# Patient Record
Sex: Male | Born: 1950 | Race: White | Hispanic: No | Marital: Married | Smoking: Former smoker
Health system: Southern US, Community
[De-identification: ages and names within clinical notes are randomized; demographics above are authoritative.]

## PROBLEM LIST (undated history)

## (undated) DIAGNOSIS — I10 Essential (primary) hypertension: Secondary | ICD-10-CM

## (undated) DIAGNOSIS — E119 Type 2 diabetes mellitus without complications: Secondary | ICD-10-CM

## (undated) DIAGNOSIS — R7989 Other specified abnormal findings of blood chemistry: Secondary | ICD-10-CM

## (undated) DIAGNOSIS — H9190 Unspecified hearing loss, unspecified ear: Secondary | ICD-10-CM

---

## 1995-12-07 HISTORY — PX: OTHER SURGICAL HISTORY: SHX169

## 1997-12-06 HISTORY — PX: KNEE ARTHROSCOPY: SUR90

## 2007-12-07 HISTORY — PX: SHOULDER ARTHROSCOPY: SHX128

## 2008-01-12 ENCOUNTER — Ambulatory Visit: Payer: Self-pay | Admitting: Orthopaedic Surgery

## 2008-01-12 ENCOUNTER — Other Ambulatory Visit: Payer: Self-pay

## 2008-01-19 ENCOUNTER — Ambulatory Visit: Payer: Self-pay | Admitting: Orthopaedic Surgery

## 2010-06-26 ENCOUNTER — Ambulatory Visit: Payer: Self-pay | Admitting: Family Medicine

## 2010-10-27 ENCOUNTER — Ambulatory Visit: Payer: Self-pay | Admitting: Gastroenterology

## 2011-04-16 ENCOUNTER — Ambulatory Visit: Payer: Self-pay

## 2011-05-07 ENCOUNTER — Ambulatory Visit: Payer: Self-pay

## 2012-07-01 ENCOUNTER — Ambulatory Visit: Payer: Self-pay | Admitting: Internal Medicine

## 2012-12-14 ENCOUNTER — Ambulatory Visit: Payer: Self-pay

## 2012-12-14 LAB — DOT URINE DIP
Blood: NEGATIVE
Specific Gravity: 1.02 (ref 1.003–1.030)

## 2014-06-09 ENCOUNTER — Ambulatory Visit: Payer: Self-pay | Admitting: Internal Medicine

## 2014-09-06 DIAGNOSIS — N529 Male erectile dysfunction, unspecified: Secondary | ICD-10-CM | POA: Insufficient documentation

## 2014-09-06 DIAGNOSIS — E785 Hyperlipidemia, unspecified: Secondary | ICD-10-CM | POA: Insufficient documentation

## 2014-09-06 DIAGNOSIS — E7800 Pure hypercholesterolemia, unspecified: Secondary | ICD-10-CM | POA: Insufficient documentation

## 2014-09-06 DIAGNOSIS — R600 Localized edema: Secondary | ICD-10-CM | POA: Insufficient documentation

## 2014-09-15 ENCOUNTER — Ambulatory Visit: Payer: Self-pay | Admitting: Family Medicine

## 2014-09-15 LAB — RAPID STREP-A WITH REFLX: Micro Text Report: NEGATIVE

## 2014-09-18 LAB — BETA STREP CULTURE(ARMC)

## 2016-10-12 DIAGNOSIS — IMO0001 Reserved for inherently not codable concepts without codable children: Secondary | ICD-10-CM | POA: Insufficient documentation

## 2016-10-12 DIAGNOSIS — E1165 Type 2 diabetes mellitus with hyperglycemia: Secondary | ICD-10-CM

## 2017-02-07 ENCOUNTER — Encounter: Payer: Self-pay | Admitting: Dietician

## 2017-02-07 ENCOUNTER — Encounter: Payer: Medicare Other | Attending: Family Medicine | Admitting: Dietician

## 2017-02-07 VITALS — BP 126/70 | Ht 70.0 in | Wt 249.0 lb

## 2017-02-07 DIAGNOSIS — Z713 Dietary counseling and surveillance: Secondary | ICD-10-CM | POA: Diagnosis not present

## 2017-02-07 DIAGNOSIS — E119 Type 2 diabetes mellitus without complications: Secondary | ICD-10-CM | POA: Diagnosis present

## 2017-02-07 NOTE — Patient Instructions (Signed)
   Include some healthy carbohydrate with each meal (whole grain, peas or corn, potato with skin, fruit). Aim for 3 carb servings or 45grams.   Keep up your healthy food choices, you are doing a great job!  Continue to control portions of starches and meats.   Continue to exercise regularly, add some walking as you are able.

## 2017-02-07 NOTE — Progress Notes (Signed)
Diabetes Self-Management Education  Visit Type: (P) First/Initial  Appt. Start Time: 0930 Appt. End Time: 1045  02/07/2017  [name], identified by name and date of birth, is a 66 y.o. male with a diagnosis of Diabetes: (P) Type 2.   ASSESSMENT  Blood pressure 126/70, height 5\' 10"  (1.778 m), weight 249 lb (112.9 kg). Body mass index is 35.73 kg/m.      Diabetes Self-Management Education - 02/07/17 0944      Visit Information   Visit Type (P)  First/Initial     Initial Visit   Diabetes Type (P)  Type 2   Are you currently following a meal plan? (P)  Yes   Date Diagnosed (P)  2003  2003     Health Coping   How would you rate your overall health? (P)  Good     Pre-Education Assessment   Patient understands the diabetes disease and treatment process. Demonstrates understanding / competency   Patient understands incorporating nutritional management into lifestyle. Needs Review   Patient undertands incorporating physical activity into lifestyle. Demonstrates understanding / competency   Patient understands using medications safely. Demonstrates understanding / competency   Patient understands monitoring blood glucose, interpreting and using results Needs Review   Patient understands prevention, detection, and treatment of acute complications. Needs Review   Patient understands prevention, detection, and treatment of chronic complications. Needs Review   Patient understands how to develop strategies to address psychosocial issues. Needs Review   Patient understands how to develop strategies to promote health/change behavior. Demonstrates understanding / competency     Complications   Last HgB A1C per patient/outside source 8 %   How often do you check your blood sugar? 3-4 times / week   Fasting Blood glucose range (mg/dL) 16-109  patient reports 110-120s   Postprandial Blood glucose range (mg/dL) 604-540  recent reading of 140   Number of hypoglycemic episodes per  month 0   Number of hyperglycemic episodes per week 0   Have you had a dilated eye exam in the past 12 months? Yes   Have you had a dental exam in the past 12 months? Yes   Are you checking your feet? Yes   How many days per week are you checking your feet? 6  occasional swelling, takes furosemide on weekends, not during week due to work.      Dietary Intake   Breakfast cornbread with butter with coffee 4am; recently started slimfast shake   Snack (morning) pork chop sandwich   Lunch  none; recently started eating sandwich   Dinner meat, vegetables and/or salad   Snack (evening) sugar free pudding   Beverage(s) sometimes diet coke, diet green tea, water     Exercise   Exercise Type Other (comment)  bowflex   How many days per week to you exercise? 5   How many minutes per day do you exercise? 15   Total minutes per week of exercise 75     Patient Education   Previous Diabetes Education No   Nutrition management  Role of diet in the treatment of diabetes and the relationship between the three main macronutrients and blood glucose level;Food label reading, portion sizes and measuring food.;Reviewed blood glucose goals for pre and post meals and how to evaluate the patients' food intake on their blood glucose level.;Meal timing in regards to the patients' current diabetes medication.;Meal options for control of blood glucose level and chronic complications.;Other (comment)  basic meal planning for 1600-1700kcal intake  Physical activity and exercise  Role of exercise on diabetes management, blood pressure control and cardiac health.;Helped patient identify appropriate exercises in relation to his/her diabetes, diabetes complications and other health issue.   Monitoring Taught/discussed recording of test results and interpretation of SMBG.   Personal strategies to promote health Lifestyle issues that need to be addressed for better diabetes care     Post-Education Assessment   Patient  understands the diabetes disease and treatment process. Demonstrates understanding / competency   Patient understands incorporating nutritional management into lifestyle. Demonstrates understanding / competency   Patient undertands incorporating physical activity into lifestyle. Demonstrates understanding / competency   Patient understands using medications safely. Demonstrates understanding / competency   Patient understands monitoring blood glucose, interpreting and using results Demonstrates understanding / competency   Patient understands prevention, detection, and treatment of acute complications. Needs Review   Patient understands prevention, detection, and treatment of chronic complications. Needs Review   Patient understands how to develop strategies to address psychosocial issues. Needs Review   Patient understands how to develop strategies to promote health/change behavior. Demonstrates understanding / competency     Outcomes   Expected Outcomes Demonstrated interest in learning. Expect positive outcomes   Future DMSE PRN   Program Status Completed      Individualized Plan for Diabetes Self-Management Training:   Learning Objective:  Patient will have a greater understanding of diabetes self-management. Patient education plan is to attend individual and/or group sessions per assessed needs and concerns.  Patient has made several positive changes in the past 1-2 weeks; he has decreased intake of carbohydrate foods and has increased vegetable intake. He has incorporated some regular exercise and plans to gradually increase. He requested help with healthy menus which were provided.    Plan:   Patient Instructions   Include some healthy carbohydrate with each meal (whole grain, peas or corn, potato with skin, fruit). Aim for 3 carb servings or 45grams.   Keep up your healthy food choices, you are doing a great job!  Continue to control portions of starches and meats.    Continue to exercise regularly, add some walking as you are able.    Expected Outcomes:  Demonstrated interest in learning. Expect positive outcomes  Education material provided: Engineer, siteMyPlate planner, Planning A Balanced Meal; Quick and Healthy Meal Ideas; Smart Snacking.   If problems or questions, patient to contact team via:  Phone and Email  Future DSME appointment: PRN

## 2017-04-04 ENCOUNTER — Ambulatory Visit
Admission: EM | Admit: 2017-04-04 | Discharge: 2017-04-04 | Disposition: A | Discharge status: Home / Self Care | Payer: Medicare Other | Attending: Family Medicine | Admitting: Family Medicine

## 2017-04-04 ENCOUNTER — Encounter: Payer: Self-pay | Admitting: *Deleted

## 2017-04-04 DIAGNOSIS — R05 Cough: Secondary | ICD-10-CM

## 2017-04-04 DIAGNOSIS — R059 Cough, unspecified: Secondary | ICD-10-CM

## 2017-04-04 DIAGNOSIS — J0110 Acute frontal sinusitis, unspecified: Secondary | ICD-10-CM

## 2017-04-04 HISTORY — DX: Type 2 diabetes mellitus without complications: E11.9

## 2017-04-04 MED ORDER — AMOXICILLIN-POT CLAVULANATE 875-125 MG PO TABS: 1.0000 | ORAL_TABLET | Freq: Two times a day (BID) | ORAL | 0 refills | Status: DC

## 2017-04-04 MED ORDER — HYDROCOD POLST-CPM POLST ER 10-8 MG/5ML PO SUER: 5.0000 mL | Freq: Every evening | ORAL | 0 refills | Status: DC | PRN

## 2017-04-04 MED ORDER — BENZONATATE 100 MG PO CAPS: 100.0000 mg | ORAL_CAPSULE | Freq: Three times a day (TID) | ORAL | 0 refills | Status: DC | PRN

## 2017-04-04 NOTE — ED Triage Notes (Signed)
Productive cough- green, runny nose, watery eyes, x1-2 weeks. Past hx of allergy problems

## 2017-04-04 NOTE — ED Provider Notes (Signed)
[facility] ____________________________________________  Time seen: Approximately 10:12 AM  I have reviewed the triage vital signs and the nursing notes.   HISTORY  Chief Complaint Cough and Nasal Congestion   HPI Donald Yang Sr. is a 66 y.o. male present for evaluation of 1.5-2 weeks of runny nose, nasal congestion, sinus pressure and sinus drainage. Reports some postnasal drainage and cough. Denies known sick contacts. Reports does have some seasonal allergies and states that he feels that's what was initiating this. Reports symptoms have been reviewed and resolved with over-the-counter DayQuil, NyQuil and Claritin. Denies known wheezing. Reports has continued to eat and drink well. Reports has continued to remain active. Denies fevers.  Denies chest pain, shortness of breath, abdominal pain, dysuria, extremity pain, extremity swelling or rash. Denies recent sickness. Denies recent antibiotic use.  Reports diabetes is been well controlled.  [facility], Madaline Guthrie, MD PCP    Past Medical History:  Diagnosis Date  . Diabetes mellitus without complication [facility])     Patient Active Problem List   Diagnosis Date Noted  . Uncontrolled type 2 diabetes mellitus without complication, without long-term current use of insulin (HCC) 10/12/2016  . Bilateral edema of lower extremity 09/06/2014  . ED (erectile dysfunction) 09/06/2014  . Elevated cholesterol 09/06/2014    History reviewed. No pertinent surgical history.   No current facility-administered medications for this encounter.   Current Outpatient Prescriptions:  .  atorvastatin (LIPITOR) 20 MG tablet, Take by mouth., Disp: , Rfl:  .  glyBURIDE (DIABETA) 5 MG tablet, TAKE 2 TABLETS TWICE A DAY FOR DIABETES, Disp: , Rfl:  .  pioglitazone (ACTOS) 45 MG tablet, TAKE 1 TABLET DAILY, Disp: , Rfl:  .  amoxicillin-clavulanate (AUGMENTIN) 875-125 MG tablet, Take 1 tablet by mouth every 12 (twelve) hours., Disp: 20  tablet, Rfl: 0 .  benzonatate (TESSALON PERLES) 100 MG capsule, Take 1 capsule (100 mg total) by mouth 3 (three) times daily as needed for cough., Disp: 15 capsule, Rfl: 0 .  chlorpheniramine-HYDROcodone (TUSSIONEX PENNKINETIC ER) 10-8 MG/5ML SUER, Take 5 mLs by mouth at bedtime as needed for cough. do not drive or operate machinery while taking as can cause drowsiness., Disp: 75 mL, Rfl: 0 .  furosemide (LASIX) 20 MG tablet, TAKE 1 TABLET DAILY, Disp: , Rfl:  .  naproxen sodium (ANAPROX) 220 MG tablet, Take by mouth., Disp: , Rfl:  .  sildenafil (VIAGRA) 100 MG tablet, TAKE 1 TABLET AS DIRECTED, Disp: , Rfl:   Allergies Metformin  History reviewed. No pertinent family history.  Social History Social History  Substance Use Topics  . Smoking status: Former Smoker    Types: Cigarettes    Quit date: 02/08/1992  . Smokeless tobacco: Never Used  . Alcohol use No     Comment: rarely 1 beer    Review of Systems Constitutional: No fever/chills ENT: No sore throat. Cardiovascular: Denies chest pain. Respiratory: Denies shortness of breath. Gastrointestinal: No abdominal pain.  No nausea, no vomiting.  No diarrhea.  No constipation. Genitourinary: Negative for dysuria. Musculoskeletal: Negative for back pain. Skin: Negative for rash.   ____________________________________________   PHYSICAL EXAM:  VITAL SIGNS: ED Triage Vitals  Enc Vitals Group     BP 04/04/17 0938 (!) 158/70     Pulse Rate 04/04/17 0938 69     Resp 04/04/17 0938 16     Temp 04/04/17 0938 98.4 F (36.9 C)     Temp Source 04/04/17 0938 Oral     SpO2 04/04/17 1610  97 %     Weight 04/04/17 0940 245 lb (111.1 kg)     Height 04/04/17 0940  (1.778 m)     Head Circumference --      Peak Flow --      Pain Score --      Pain Loc --      Pain Edu? --      Excl. in GC? --     Constitutional: Alert and oriented. Well appearing and in no acute distress. Eyes: Conjunctivae are normal. PERRL. EOMI. Head:  Atraumatic.Mild to moderate tenderness to palpation bilateral frontal and nontender maxillary sinuses. No swelling. No erythema.   Ears: no erythema, normal TMs bilaterally.   Nose: nasal congestion with bilateral nasal turbinate erythema and edema.   Mouth/Throat: Mucous membranes are moist.  Oropharynx non-erythematous.No tonsillar swelling or exudate.  Neck: No stridor.  No cervical spine tenderness to palpation. Hematological/Lymphatic/Immunilogical: No cervical lymphadenopathy. Cardiovascular: Normal rate, regular rhythm. Grossly normal heart sounds.  Good peripheral circulation. Respiratory: Normal respiratory effort.  No retractions.No wheezes, rales or rhonchi. Good air movement. Dry intermittent cough noted in room. Gastrointestinal: Soft and nontender.  Musculoskeletal: Ambulatory with steady gait. Neurologic:  Normal speech and language. No gait instability. Skin:  Skin is warm, dry and intact. No rash noted. Psychiatric: Mood and affect are normal. Speech and behavior are normal.  ___________________________________________   LABS (all labs ordered are listed, but only abnormal results are displayed)  Labs Reviewed - No data to display   PROCEDURES Procedures    INITIAL IMPRESSION / ASSESSMENT AND PLAN / ED COURSE  Pertinent labs & imaging results that were available during my care of the patient were reviewed by me and considered in my medical decision making (see chart for details).  Well-appearing patient. No acute distress. Suspect sinusitis, allergic rhinitis with associated cough. Will treat patient with oral Augmentin, when necessary Tessalon Perles and when necessary Tussionex. Encourage rest, fluids and supportive care.Discussed indication, risks and benefits of medications with patient.  Discussed follow up with Primary care physician this week. Discussed follow up and return parameters including no resolution or any worsening concerns. Patient verbalized  understanding and agreed to plan.   ____________________________________________   FINAL CLINICAL IMPRESSION(S) / ED DIAGNOSES  Final diagnoses:  Acute frontal sinusitis, recurrence not specified  Cough     Discharge Medication List as of 04/04/2017 10:14 AM    START taking these medications   Details  amoxicillin-clavulanate (AUGMENTIN) 875-125 MG tablet Take 1 tablet by mouth every 12 (twelve) hours., Starting Mon 04/04/2017, Normal    benzonatate (TESSALON PERLES) 100 MG capsule Take 1 capsule (100 mg total) by mouth 3 (three) times daily as needed for cough., Starting Mon 04/04/2017, Normal    chlorpheniramine-HYDROcodone (TUSSIONEX PENNKINETIC ER) 10-8 MG/5ML SUER Take 5 mLs by mouth at bedtime as needed for cough. do not drive or operate machinery while taking as can cause drowsiness., Starting Mon 04/04/2017, Print        Note: This dictation was prepared with Dragon dictation along with smaller phrase technology. Any transcriptional errors that result from this process are unintentional.         Renford Dills, NP 04/04/17 1051

## 2017-04-04 NOTE — Discharge Instructions (Signed)
Take medication as prescribed. Rest. Drink plenty of fluids.  ° °Follow up with your primary care physician this week as needed. Return to Urgent care for new or worsening concerns.  ° °

## 2018-11-30 ENCOUNTER — Other Ambulatory Visit: Payer: Self-pay

## 2018-11-30 ENCOUNTER — Encounter
Admission: RE | Admit: 2018-11-30 | Discharge: 2018-11-30 | Disposition: A | Discharge status: Home / Self Care | Payer: Medicare Other | Source: Ambulatory Visit | Attending: Orthopedic Surgery | Admitting: Orthopedic Surgery

## 2018-11-30 DIAGNOSIS — Z01812 Encounter for preprocedural laboratory examination: Secondary | ICD-10-CM | POA: Insufficient documentation

## 2018-11-30 DIAGNOSIS — Z01810 Encounter for preprocedural cardiovascular examination: Secondary | ICD-10-CM | POA: Diagnosis not present

## 2018-11-30 HISTORY — DX: Other specified abnormal findings of blood chemistry: R79.89

## 2018-11-30 HISTORY — DX: Other disorders of bilirubin metabolism: E80.6

## 2018-11-30 LAB — CBC
HCT: 41.6 % (ref 39.0–52.0)
HEMOGLOBIN: 14.6 g/dL (ref 13.0–17.0)
MCH: 29.9 pg (ref 26.0–34.0)
MCHC: 35.1 g/dL (ref 30.0–36.0)
MCV: 85.1 fL (ref 80.0–100.0)
Platelets: 227 10*3/uL (ref 150–400)
RBC: 4.89 MIL/uL (ref 4.22–5.81)
RDW: 12.8 % (ref 11.5–15.5)
WBC: 7.1 10*3/uL (ref 4.0–10.5)
nRBC: 0 % (ref 0.0–0.2)

## 2018-11-30 NOTE — Patient Instructions (Signed)
Your procedure is scheduled on: 12/11/18 Report to Day Surgery. [facility]LO[facility] To find out your arrival time please call [phone] between 1PM - 3PM on 12/08/17.  Remember: Instructions that are not followed completely may result in serious medical risk,  up to and including death, or upon the discretion of your surgeon and anesthesiologist your  surgery may need to be rescheduled.     _X__ 1. Do not eat food after midnight the night before your procedure.                 No gum chewing or hard candies. You may drink clear liquids up to 2 hours                 before you are scheduled to arrive for your surgery- DO not drink clear                 liquids within 2 hours of the start of your surgery.                 Clear Liquids include:  water, apple juice without pulp, clear carbohydrate                 drink such as Clearfast of Gatorade, Black Coffee or Tea (Do not add                 anything to coffee or tea).  __X__2.  On the morning of surgery brush your teeth with toothpaste and water, you                may rinse your mouth with mouthwash if you wish.  Do not swallow any toothpaste of mouthwash.     _X__ 3.  No Alcohol for 24 hours before or after surgery.   _X__ 4.  Do Not Smoke or use e-cigarettes For 24 Hours Prior to Your Surgery.                 Do not use any chewable tobacco products for at least 6 hours prior to                 surgery.  ____  5.  Bring all medications with you on the day of surgery if instructed.   __X__  6.  Notify your doctor if there is any change in your medical condition      (cold, fever, infections).     Do not wear jewelry, make-up, hairpins, clips or nail polish. Do not wear lotions, powders, or perfumes. You may wear deodorant. Do not shave 48 hours prior to surgery. Men may shave face and neck. Do not bring valuables to the hospital.    [facility] any belongings or  valuables.  Contacts, dentures or bridgework may not be worn into surgery. Leave your suitcase in the car. After surgery it may be brought to your room. For patients admitted to the hospital, discharge time is determined by your treatment team.   Patients discharged the day of surgery will not be allowed to drive home.   Please read over the following fact sheets that you were given:   Surgical Site Infection Prevention    ____ Take these medicines the morning of surgery with A SIP OF WATER:    1. NONE  2.   3.   4.  5.  6.  ____ Fleet Enema (as directed)   __X__ Use CHG Soap as directed  ____ Use inhalers on the day of surgery  ____ Stop metformin 2 days prior to surgery    ____ Take 1/2 of usual insulin dose the night before surgery. No insulin the morning          of surgery.   ____ Stop Coumadin/Plavix/aspirin on   __X__ Stop Anti-inflammatories on  11/30/18  NO MORE ALEVE UNTIL AFTER SURGERY.   CAN TAKE TYLENOL  ____ Stop supplements until after surgery.    ____ Bring C-Pap to the hospital.

## 2018-12-11 ENCOUNTER — Encounter: Payer: Self-pay | Admitting: *Deleted

## 2018-12-11 ENCOUNTER — Other Ambulatory Visit: Payer: Self-pay

## 2018-12-11 ENCOUNTER — Ambulatory Visit: Payer: Medicare Other | Admitting: Anesthesiology

## 2018-12-11 ENCOUNTER — Encounter
Admission: RE | Disposition: A | Discharge status: Home / Self Care | Payer: Self-pay | Source: Home / Self Care | Attending: Orthopedic Surgery

## 2018-12-11 ENCOUNTER — Ambulatory Visit
Admission: RE | Admit: 2018-12-11 | Discharge: 2018-12-11 | Disposition: A | Discharge status: Home / Self Care | Payer: Medicare Other | Attending: Orthopedic Surgery | Admitting: Orthopedic Surgery

## 2018-12-11 DIAGNOSIS — Z7984 Long term (current) use of oral hypoglycemic drugs: Secondary | ICD-10-CM | POA: Diagnosis not present

## 2018-12-11 DIAGNOSIS — M75101 Unspecified rotator cuff tear or rupture of right shoulder, not specified as traumatic: Secondary | ICD-10-CM | POA: Insufficient documentation

## 2018-12-11 DIAGNOSIS — Z87891 Personal history of nicotine dependence: Secondary | ICD-10-CM | POA: Diagnosis not present

## 2018-12-11 DIAGNOSIS — S46211A Strain of muscle, fascia and tendon of other parts of biceps, right arm, initial encounter: Secondary | ICD-10-CM | POA: Insufficient documentation

## 2018-12-11 DIAGNOSIS — E119 Type 2 diabetes mellitus without complications: Secondary | ICD-10-CM | POA: Insufficient documentation

## 2018-12-11 DIAGNOSIS — M7541 Impingement syndrome of right shoulder: Secondary | ICD-10-CM | POA: Diagnosis not present

## 2018-12-11 HISTORY — DX: Unspecified hearing loss, unspecified ear: H91.90

## 2018-12-11 LAB — GLUCOSE, CAPILLARY
Glucose-Capillary: 139 mg/dL — ABNORMAL HIGH (ref 70–99)
Glucose-Capillary: 150 mg/dL — ABNORMAL HIGH (ref 70–99)

## 2018-12-11 SURGERY — SHOULDER ARTHROSCOPY WITH SUBACROMIAL DECOMPRESSION AND DISTAL CLAVICLE EXCISION
Anesthesia: General | Site: Shoulder | Laterality: Right

## 2018-12-11 MED ORDER — MIDAZOLAM HCL 2 MG/2ML IJ SOLN
2.0000 mg | Freq: Once | INTRAMUSCULAR | Status: AC
  Administered 2018-12-11: 2 mg via INTRAVENOUS

## 2018-12-11 MED ORDER — ACETAMINOPHEN 500 MG PO TABS: 1000.0000 mg | ORAL_TABLET | Freq: Three times a day (TID) | ORAL | 2 refills | Status: AC

## 2018-12-11 MED ORDER — FENTANYL CITRATE (PF) 100 MCG/2ML IJ SOLN
INTRAMUSCULAR | Status: AC
  Administered 2018-12-11: 50 ug via INTRAVENOUS
  Filled 2018-12-11: qty 2

## 2018-12-11 MED ORDER — ACETAMINOPHEN 10 MG/ML IV SOLN
INTRAVENOUS | Status: AC
  Filled 2018-12-11: qty 100

## 2018-12-11 MED ORDER — LIDOCAINE HCL (PF) 1 % IJ SOLN
INTRAMUSCULAR | Status: AC
  Filled 2018-12-11: qty 30

## 2018-12-11 MED ORDER — BUPIVACAINE HCL (PF) 0.5 % IJ SOLN
INTRAMUSCULAR | Status: AC
  Filled 2018-12-11: qty 10

## 2018-12-11 MED ORDER — BUPIVACAINE HCL (PF) 0.5 % IJ SOLN
INTRAMUSCULAR | Status: DC | PRN
  Administered 2018-12-11: 10 mL

## 2018-12-11 MED ORDER — FENTANYL CITRATE (PF) 100 MCG/2ML IJ SOLN
50.0000 ug | Freq: Once | INTRAMUSCULAR | Status: AC
  Administered 2018-12-11: 50 ug via INTRAVENOUS

## 2018-12-11 MED ORDER — SEVOFLURANE IN SOLN
RESPIRATORY_TRACT | Status: AC
  Filled 2018-12-11: qty 250

## 2018-12-11 MED ORDER — EPHEDRINE SULFATE 50 MG/ML IJ SOLN
INTRAMUSCULAR | Status: DC | PRN
  Administered 2018-12-11: 10 mg via INTRAVENOUS
  Administered 2018-12-11 (×3): 5 mg via INTRAVENOUS

## 2018-12-11 MED ORDER — ONDANSETRON HCL 4 MG/2ML IJ SOLN
INTRAMUSCULAR | Status: AC
  Filled 2018-12-11: qty 2

## 2018-12-11 MED ORDER — FAMOTIDINE 20 MG PO TABS
ORAL_TABLET | ORAL | Status: AC
  Administered 2018-12-11: 20 mg via ORAL
  Filled 2018-12-11: qty 1

## 2018-12-11 MED ORDER — BUPIVACAINE LIPOSOME 1.3 % IJ SUSP
INTRAMUSCULAR | Status: DC | PRN
  Administered 2018-12-11: 20 mL

## 2018-12-11 MED ORDER — ROCURONIUM BROMIDE 100 MG/10ML IV SOLN
INTRAVENOUS | Status: DC | PRN
  Administered 2018-12-11: 20 mg via INTRAVENOUS
  Administered 2018-12-11: 10 mg via INTRAVENOUS
  Administered 2018-12-11: 20 mg via INTRAVENOUS
  Administered 2018-12-11: 5 mg via INTRAVENOUS
  Administered 2018-12-11: 35 mg via INTRAVENOUS
  Administered 2018-12-11: 10 mg via INTRAVENOUS

## 2018-12-11 MED ORDER — NEOMYCIN-POLYMYXIN B GU 40-200000 IR SOLN
Status: DC | PRN
  Administered 2018-12-11: 2 mL

## 2018-12-11 MED ORDER — CEFAZOLIN SODIUM 1 G IJ SOLR
INTRAMUSCULAR | Status: AC
  Filled 2018-12-11: qty 20

## 2018-12-11 MED ORDER — LIDOCAINE HCL (CARDIAC) PF 100 MG/5ML IV SOSY
PREFILLED_SYRINGE | INTRAVENOUS | Status: DC | PRN
  Administered 2018-12-11: 80 mg via INTRAVENOUS

## 2018-12-11 MED ORDER — SODIUM CHLORIDE FLUSH 0.9 % IV SOLN
INTRAVENOUS | Status: AC
  Filled 2018-12-11: qty 10

## 2018-12-11 MED ORDER — PROMETHAZINE HCL 25 MG/ML IJ SOLN
INTRAMUSCULAR | Status: AC
  Administered 2018-12-11: 6.25 mg via INTRAVENOUS
  Filled 2018-12-11: qty 1

## 2018-12-11 MED ORDER — MIDAZOLAM HCL 2 MG/2ML IJ SOLN
INTRAMUSCULAR | Status: DC | PRN
  Administered 2018-12-11 (×2): .5 mg via INTRAVENOUS

## 2018-12-11 MED ORDER — ONDANSETRON HCL 4 MG/2ML IJ SOLN
INTRAMUSCULAR | Status: DC | PRN
  Administered 2018-12-11: 4 mg via INTRAVENOUS

## 2018-12-11 MED ORDER — BUPIVACAINE LIPOSOME 1.3 % IJ SUSP
INTRAMUSCULAR | Status: AC
  Filled 2018-12-11: qty 20

## 2018-12-11 MED ORDER — DEXAMETHASONE SODIUM PHOSPHATE 10 MG/ML IJ SOLN
INTRAMUSCULAR | Status: DC | PRN
  Administered 2018-12-11: 5 mg via INTRAVENOUS

## 2018-12-11 MED ORDER — ONDANSETRON HCL 4 MG/2ML IJ SOLN
4.0000 mg | Freq: Once | INTRAMUSCULAR | Status: AC | PRN
  Administered 2018-12-11: 4 mg via INTRAVENOUS

## 2018-12-11 MED ORDER — PROPOFOL 10 MG/ML IV BOLUS
INTRAVENOUS | Status: DC | PRN
  Administered 2018-12-11: 140 mg via INTRAVENOUS

## 2018-12-11 MED ORDER — MIDAZOLAM HCL 2 MG/2ML IJ SOLN
INTRAMUSCULAR | Status: AC
  Administered 2018-12-11: 2 mg via INTRAVENOUS
  Filled 2018-12-11: qty 2

## 2018-12-11 MED ORDER — SUGAMMADEX SODIUM 200 MG/2ML IV SOLN
INTRAVENOUS | Status: DC | PRN
  Administered 2018-12-11: 200 mg via INTRAVENOUS

## 2018-12-11 MED ORDER — LIDOCAINE HCL (PF) 1 % IJ SOLN
INTRAMUSCULAR | Status: AC
  Filled 2018-12-11: qty 5

## 2018-12-11 MED ORDER — NEOMYCIN-POLYMYXIN B GU 40-200000 IR SOLN
Status: AC
  Filled 2018-12-11: qty 2

## 2018-12-11 MED ORDER — EPINEPHRINE 30 MG/30ML IJ SOLN
INTRAMUSCULAR | Status: AC
  Filled 2018-12-11: qty 1

## 2018-12-11 MED ORDER — LIDOCAINE-EPINEPHRINE 1 %-1:100000 IJ SOLN
INTRAMUSCULAR | Status: AC
  Filled 2018-12-11: qty 1

## 2018-12-11 MED ORDER — ROCURONIUM BROMIDE 50 MG/5ML IV SOLN
INTRAVENOUS | Status: AC
  Filled 2018-12-11: qty 1

## 2018-12-11 MED ORDER — SUCCINYLCHOLINE CHLORIDE 20 MG/ML IJ SOLN
INTRAMUSCULAR | Status: AC
  Filled 2018-12-11: qty 1

## 2018-12-11 MED ORDER — GLYCOPYRROLATE 0.2 MG/ML IJ SOLN
INTRAMUSCULAR | Status: DC | PRN
  Administered 2018-12-11: .2 mg via INTRAVENOUS

## 2018-12-11 MED ORDER — PROPOFOL 10 MG/ML IV BOLUS
INTRAVENOUS | Status: AC
  Filled 2018-12-11: qty 20

## 2018-12-11 MED ORDER — FENTANYL CITRATE (PF) 100 MCG/2ML IJ SOLN
INTRAMUSCULAR | Status: DC | PRN
  Administered 2018-12-11: 50 ug via INTRAVENOUS

## 2018-12-11 MED ORDER — OXYCODONE HCL 5 MG PO TABS: 5.0000 mg | ORAL_TABLET | ORAL | 0 refills | Status: AC | PRN

## 2018-12-11 MED ORDER — ASPIRIN EC 325 MG PO TBEC: 325.0000 mg | DELAYED_RELEASE_TABLET | Freq: Every day | ORAL | 0 refills | Status: AC

## 2018-12-11 MED ORDER — LACTATED RINGERS IV SOLN
INTRAVENOUS | Status: DC | PRN
  Administered 2018-12-11: 30 mL

## 2018-12-11 MED ORDER — FENTANYL CITRATE (PF) 100 MCG/2ML IJ SOLN
INTRAMUSCULAR | Status: AC
  Filled 2018-12-11: qty 2

## 2018-12-11 MED ORDER — FAMOTIDINE 20 MG PO TABS
20.0000 mg | ORAL_TABLET | Freq: Once | ORAL | Status: AC
  Administered 2018-12-11: 20 mg via ORAL

## 2018-12-11 MED ORDER — SUCCINYLCHOLINE CHLORIDE 20 MG/ML IJ SOLN
INTRAMUSCULAR | Status: DC | PRN
  Administered 2018-12-11: 120 mg via INTRAVENOUS

## 2018-12-11 MED ORDER — PROMETHAZINE HCL 25 MG/ML IJ SOLN
6.2500 mg | Freq: Once | INTRAMUSCULAR | Status: AC
  Administered 2018-12-11: 6.25 mg via INTRAVENOUS

## 2018-12-11 MED ORDER — SUGAMMADEX SODIUM 200 MG/2ML IV SOLN
INTRAVENOUS | Status: AC
  Filled 2018-12-11: qty 2

## 2018-12-11 MED ORDER — ONDANSETRON 4 MG PO TBDP: 4.0000 mg | ORAL_TABLET | Freq: Three times a day (TID) | ORAL | 0 refills | Status: DC | PRN

## 2018-12-11 MED ORDER — ESMOLOL HCL 100 MG/10ML IV SOLN
INTRAVENOUS | Status: DC | PRN
  Administered 2018-12-11 (×2): 10 mg via INTRAVENOUS

## 2018-12-11 MED ORDER — CEFAZOLIN SODIUM-DEXTROSE 2-4 GM/100ML-% IV SOLN
INTRAVENOUS | Status: AC
  Filled 2018-12-11: qty 100

## 2018-12-11 MED ORDER — MIDAZOLAM HCL 2 MG/2ML IJ SOLN
INTRAMUSCULAR | Status: AC
  Filled 2018-12-11: qty 2

## 2018-12-11 MED ORDER — SODIUM CHLORIDE (PF) 0.9 % IJ SOLN
INTRAMUSCULAR | Status: AC
  Filled 2018-12-11: qty 20

## 2018-12-11 MED ORDER — CEFAZOLIN SODIUM-DEXTROSE 2-4 GM/100ML-% IV SOLN
2.0000 g | Freq: Once | INTRAVENOUS | Status: AC
  Administered 2018-12-11 (×2): 2 g via INTRAVENOUS

## 2018-12-11 MED ORDER — ACETAMINOPHEN 10 MG/ML IV SOLN
INTRAVENOUS | Status: DC | PRN
  Administered 2018-12-11: 1000 mg via INTRAVENOUS

## 2018-12-11 MED ORDER — FENTANYL CITRATE (PF) 100 MCG/2ML IJ SOLN: 25.0000 ug | INTRAMUSCULAR | Status: DC | PRN

## 2018-12-11 MED ORDER — BUPIVACAINE-EPINEPHRINE (PF) 0.5% -1:200000 IJ SOLN
INTRAMUSCULAR | Status: AC
  Filled 2018-12-11: qty 90

## 2018-12-11 MED ORDER — SODIUM CHLORIDE 0.9 % IV SOLN
INTRAVENOUS | Status: DC
  Administered 2018-12-11: 14:00:00 via INTRAVENOUS
  Administered 2018-12-11: 75 mL/h via INTRAVENOUS

## 2018-12-11 SURGICAL SUPPLY — 79 items
ADAPTER IRRIG TUBE 2 SPIKE SOL (ADAPTER) ×6 IMPLANT
ANCHOR SUT BIO SW 4.75X19.1 (Anchor) ×4 IMPLANT
ANCHOR SWIVELOCK BIO COMP (Anchor) ×2 IMPLANT
BRUSH SCRUB EZ  4% CHG (MISCELLANEOUS) ×2
BRUSH SCRUB EZ 4% CHG (MISCELLANEOUS) ×1 IMPLANT
BUR RADIUS 4.0X18.5 (BURR) ×5 IMPLANT
BUR RADIUS 5.5 (BURR) ×3 IMPLANT
CANNULA 5.75X7CM (CANNULA) ×2
CANNULA PART THRD DISP 5.75X7 (CANNULA) ×4 IMPLANT
CANNULA PARTIAL THREAD 2X7 (CANNULA) ×5 IMPLANT
CANNULA PASSPORT 3 (MISCELLANEOUS) ×1 IMPLANT
CANNULA PASSPORT 3CM (MISCELLANEOUS) ×1
CANNULA TWIST IN 8.25X9CM (CANNULA) IMPLANT
CHLORAPREP W/TINT 26ML (MISCELLANEOUS) ×3 IMPLANT
CLOSURE WOUND 1/2 X4 (GAUZE/BANDAGES/DRESSINGS) ×1
COOLER POLAR GLACIER W/PUMP (MISCELLANEOUS) ×3 IMPLANT
COVER WAND RF STERILE (DRAPES) ×3 IMPLANT
CRADLE LAMINECT ARM (MISCELLANEOUS) ×3 IMPLANT
DRAPE IMP U-DRAPE 54X76 (DRAPES) ×6 IMPLANT
DRAPE INCISE IOBAN 66X45 STRL (DRAPES) ×3 IMPLANT
DRAPE SHEET LG 3/4 BI-LAMINATE (DRAPES) ×3 IMPLANT
DRAPE STERI 35X30 U-POUCH (DRAPES) ×3 IMPLANT
DRSG TEGADERM 2-3/8X2-3/4 SM (GAUZE/BANDAGES/DRESSINGS) ×4 IMPLANT
DRSG TEGADERM 4X4.75 (GAUZE/BANDAGES/DRESSINGS) ×2 IMPLANT
ELECT REM PT RETURN 9FT ADLT (ELECTROSURGICAL)
ELECTRODE REM PT RTRN 9FT ADLT (ELECTROSURGICAL) IMPLANT
GAUZE PETRO XEROFOAM 1X8 (MISCELLANEOUS) ×3 IMPLANT
GAUZE SPONGE 4X4 12PLY STRL (GAUZE/BANDAGES/DRESSINGS) ×3 IMPLANT
GAUZE XEROFORM 4X4 STRL (GAUZE/BANDAGES/DRESSINGS) ×2 IMPLANT
GLOVE BIO SURGEON STRL SZ7.5 (GLOVE) ×3 IMPLANT
GLOVE BIOGEL PI IND STRL 8 (GLOVE) ×2 IMPLANT
GLOVE BIOGEL PI INDICATOR 8 (GLOVE) ×4
GLOVE SURG SYN 7.5  E (GLOVE) ×2
GLOVE SURG SYN 7.5 E (GLOVE) ×1 IMPLANT
GLOVE SURG SYN 7.5 PF PI (GLOVE) ×1 IMPLANT
GOWN STRL REUS W/ TWL LRG LVL3 (GOWN DISPOSABLE) ×2 IMPLANT
GOWN STRL REUS W/TWL LRG LVL3 (GOWN DISPOSABLE) ×4
GOWN STRL REUS W/TWL LRG LVL4 (GOWN DISPOSABLE) ×3 IMPLANT
IMP SYSTEM BRIDGE 4.75X19.1 (Anchor) ×3 IMPLANT
IMPL SYSTEM BRIDGE 4.75X19.1 (Anchor) IMPLANT
IV LACTATED RINGER IRRG 3000ML (IV SOLUTION) ×60
IV LR IRRIG 3000ML ARTHROMATIC (IV SOLUTION) ×4 IMPLANT
KIT STABILIZATION SHOULDER (MISCELLANEOUS) ×3 IMPLANT
KIT SUTURETAK 3.0 INSERT PERC (KITS) ×3 IMPLANT
KIT TURNOVER KIT A (KITS) ×3 IMPLANT
MANIFOLD NEPTUNE II (INSTRUMENTS) ×3 IMPLANT
MASK FACE SPIDER DISP (MASK) ×3 IMPLANT
MAT ABSORB  FLUID 56X50 GRAY (MISCELLANEOUS) ×4
MAT ABSORB FLUID 56X50 GRAY (MISCELLANEOUS) ×2 IMPLANT
NDL SAFETY ECLIPSE 18X1.5 (NEEDLE) ×1 IMPLANT
NDL SCORPION MULTI FIRE (NEEDLE) IMPLANT
NEEDLE HYPO 18GX1.5 SHARP (NEEDLE) ×2
NEEDLE HYPO 22GX1.5 SAFETY (NEEDLE) ×3 IMPLANT
NEEDLE SCORPION MULTI FIRE (NEEDLE) ×3 IMPLANT
NS IRRIG 500ML POUR BTL (IV SOLUTION) ×3 IMPLANT
PACK ARTHROSCOPY SHOULDER (MISCELLANEOUS) ×3 IMPLANT
PAD ABD DERMACEA PRESS 5X9 (GAUZE/BANDAGES/DRESSINGS) ×3 IMPLANT
PAD WRAPON POLAR SHDR XLG (MISCELLANEOUS) ×1 IMPLANT
SET TUBE SUCT SHAVER OUTFL 24K (TUBING) ×3 IMPLANT
SET TUBE TIP INTRA-ARTICULAR (MISCELLANEOUS) ×3 IMPLANT
SLING ULTRA II M (MISCELLANEOUS) ×3 IMPLANT
STRAP SAFETY 5IN WIDE (MISCELLANEOUS) ×3 IMPLANT
STRIP CLOSURE SKIN 1/2X4 (GAUZE/BANDAGES/DRESSINGS) ×2 IMPLANT
SUT ETHILON 4-0 (SUTURE) ×2
SUT ETHILON 4-0 FS2 18XMFL BLK (SUTURE) ×1
SUT LASSO 90 DEG SD STR (SUTURE) ×5 IMPLANT
SUT MNCRL 4-0 (SUTURE) ×2
SUT MNCRL 4-0 27XMFL (SUTURE) ×1
SUT TIGER TAPE 7 IN WHITE (SUTURE) ×4 IMPLANT
SUTURE ETHLN 4-0 FS2 18XMF BLK (SUTURE) ×1 IMPLANT
SUTURE MNCRL 4-0 27XMF (SUTURE) ×1 IMPLANT
SYR 10ML LL (SYRINGE) ×3 IMPLANT
TAPE CLOTH 3X10 WHT NS LF (GAUZE/BANDAGES/DRESSINGS) ×3 IMPLANT
TAPE MICROFOAM 4IN (TAPE) ×3 IMPLANT
TUBING ARTHRO INFLOW-ONLY STRL (TUBING) ×3 IMPLANT
TUBING CONNECTING 10 (TUBING) ×2 IMPLANT
TUBING CONNECTING 10' (TUBING) ×1
WAND HAND CNTRL MULTIVAC 90 (MISCELLANEOUS) IMPLANT
WRAPON POLAR PAD SHDR XLG (MISCELLANEOUS) ×3

## 2018-12-11 NOTE — Transfer of Care (Signed)
Immediate Anesthesia Transfer of Care Note  Patient: Donald Mariner Sr.  Procedure(s) Performed: SHOULDER ARTHROSCOPY VS MINI OPEN ROTATOR CUFF REPAIRWITH SUBACROMIAL DECOMPRESSION AND DISTAL CLAVICLE EXCISION BICEPS TENODESIS (Right Shoulder)  Patient Location: PACU  Anesthesia Type:General  Level of Consciousness: sedated  Airway & Oxygen Therapy: Patient Spontanous Breathing and Patient connected to face mask oxygen  Post-op Assessment: Report given to RN and Post -op Vital signs reviewed and stable  Post vital signs: Reviewed and stable  Last Vitals:  Vitals Value Taken Time  BP 159/57 12/11/2018  4:25 PM  Temp 36.3 C 12/11/2018  4:25 PM  Pulse 74 12/11/2018  4:27 PM  Resp 19 12/11/2018  4:27 PM  SpO2 100 % 12/11/2018  4:27 PM  Vitals shown include unvalidated device data.  Last Pain:  Vitals:   12/11/18 1625  TempSrc:   PainSc: Asleep      Patients Stated Pain Goal: 1 (12/11/18 0916)  Complications: No apparent anesthesia complications

## 2018-12-11 NOTE — OR Nursing (Signed)
Fatty tissue lump noted on back, near neck to the left side. Moves side to side freely. Patient unaware of it and denies any pain from site.

## 2018-12-11 NOTE — Discharge Instructions (Signed)
AMBULATORY SURGERY  DISCHARGE INSTRUCTIONS   1) The drugs that you were given will stay in your system until tomorrow so for the next 24 hours you should not:  A) Drive an automobile B) Make any legal decisions C) Drink any alcoholic beverage   2) You may resume regular meals tomorrow.  Today it is better to start with liquids and gradually work up to solid foods.  You may eat anything you prefer, but it is better to start with liquids, then soup and crackers, and gradually work up to solid foods.   3) Please notify your doctor immediately if you have any unusual bleeding, trouble breathing, redness and pain at the surgery site, drainage, fever, or pain not relieved by medication. 4)   5) Your post-operative visit with Dr.                                     is: Date:                        Time:    Please call to schedule your post-operative visit.  6) Additional Instructions:      Post-Op Instructions - Rotator Cuff Repair  1. Bracing: You will wear a shoulder immobilizer or sling for 6 weeks.   2. Driving: No driving for 3 weeks post-op. When driving, do not wear the immobilizer. Ideally, we recommend no driving for 6 weeks while sling is in place as one arm will be immobilized.   3. Activity: No active lifting for 2 months. Wrist, hand, and elbow motion only. Avoid lifting the upper arm away from the body except for hygiene. You are permitted to bend and straighten the elbow passively only (no active elbow motion). You may use your hand and wrist for typing, writing, and managing utensils (cutting food). Do not lift more than a coffee cup for 8 weeks.  When sleeping or resting, inclined positions (recliner chair or wedge pillow) and a pillow under the forearm for support may provide better comfort for up to 4 weeks.  Avoid long distance travel for 4 weeks.  Return to normal activities after rotator cuff repair repair normally takes 6 months on average. If rehab goes very  well, may be able to do most activities at 4 months, except overhead or contact sports.  4. Physical Therapy: Begins 3-4 days after surgery, and proceed 1 time per week for the first 6 weeks, then 1-2 times per week from weeks 6-20 post-op.  5. Medications:  - You will be provided a prescription for narcotic pain medicine. After surgery, take 1-2 narcotic tablets every 4 hours if needed for severe pain.  - A prescription for anti-nausea medication will be provided in case the narcotic medicine causes nausea - take 1 tablet every 6 hours only if nauseated.   - Take tylenol 1000 mg (2 Extra Strength tablets or 3 regular strength) every 8 hours for pain.  May decrease or stop tylenol 5 days after surgery if you are having minimal pain. - Take ASA 325mg /day x 2 weeks to help prevent DVTs/PEs (blood clots).  - DO NOT take ANY nonsteroidal anti-inflammatory pain medications (Advil, Motrin, Ibuprofen, Aleve, Naproxen, or Naprosyn). These medicines can inhibit healing of your shoulder repair.    If you are taking prescription medication for anxiety, depression, insomnia, muscle spasm, chronic pain, or for attention deficit disorder, you are  advised that you are at a higher risk of adverse effects with use of narcotics post-op, including narcotic addiction/dependence, depressed breathing, death. If you use non-prescribed substances: alcohol, marijuana, cocaine, heroin, methamphetamines, etc., you are at a higher risk of adverse effects with use of narcotics post-op, including narcotic addiction/dependence, depressed breathing, death. You are advised that taking > 50 morphine milligram equivalents (MME) of narcotic pain medication per day results in twice the risk of overdose or death. For your prescription provided: oxycodone 5 mg - taking more than 6 tablets per day would result in > 50 morphine milligram equivalents (MME) of narcotic pain medication. Be advised that we will prescribe narcotics short-term,  for acute post-operative pain only - 3 weeks for major operations such as shoulder repair/reconstruction surgeries.     6. Post-Op Appointment:  Your first post-op appointment will be 10-14 days post-op.  7. Work or School: For most, but not all procedures, we advise staying out of work or school for at least 1 to 2 weeks in order to recover from the stress of surgery and to allow time for healing.   If you need a work or school note this can be provided.   8. Smoking: If you are a smoker, you need to refrain from smoking in the postoperative period. The nicotine in cigarettes will inhibit healing of your shoulder repair and decrease the chance of successful repair. Similarly, nicotine containing products (gum, patches) should be avoided.   Post-operative Brace: Apply and remove the brace you received as you were instructed to at the time of fitting and as described in detail as the braces instructions for use indicate.  Wear the brace for the period of time prescribed by your physician.  The brace can be cleaned with soap and water and allowed to air dry only.  Should the brace result in increased pain, decreased feeling (numbness/tingling), increased swelling or an overall worsening of your medical condition, please contact your doctor immediately.  If an emergency situation occurs as a result of wearing the brace after normal business hours, please dial 911 and seek immediate medical attention.  Let your doctor know if you have any further questions about the brace issued to you. Refer to the shoulder sling instructions for use if you have any questions regarding the correct fit of your shoulder sling.  [facility] Customer Care for Troubleshooting: [phone]  Video that illustrates how to properly use a shoulder sling: "Instructions for Proper Use of an Orthopaedic Sling" http://bass.com/            Interscalene Nerve Block with Exparel  1.  For your  surgery you have received an Interscalene Nerve Block with Exparel. 2. Nerve Blocks affect many types of nerves, including nerves that control movement, pain and normal sensation.  You may experience feelings such as numbness, tingling, heaviness, weakness or the inability to move your arm or the feeling or sensation that your arm has "fallen asleep". 3. A nerve block with Exparel can last up to 5 days.  Usually the weakness wears off first.  The tingling and heaviness usually wear off next.  Finally you may start to notice pain.  Keep in mind that this may occur in any order.  Once a nerve block starts to wear off it is usually completely gone within 60 minutes. 4. ISNB may cause mild shortness of breath, a hoarse voice, blurry vision, unequal pupils, or drooping of the face on the same side as the nerve block.  These  symptoms will usually resolve with the numbness.  Very rarely the procedure itself can cause mild seizures. 5. If needed, your surgeon will give you a prescription for pain medication.  It will take about 60 minutes for the oral pain medication to become fully effective.  So, it is recommended that you start taking this medication before the nerve block first begins to wear off, or when you first begin to feel discomfort. 6. Take your pain medication only as prescribed.  Pain medication can cause sedation and decrease your breathing if you take more than you need for the level of pain that you have. 7. Nausea is a common side effect of many pain medications.  You may want to eat something before taking your pain medicine to prevent nausea. 8. After an Interscalene nerve block, you cannot feel pain, pressure or extremes in temperature in the effected arm.  Because your arm is numb it is at an increased risk for injury.  To decrease the possibility of injury, please practice the following:  a. While you are awake change the position of your arm frequently to prevent too much pressure on any one  area for prolonged periods of time. b.  If you have a cast or tight dressing, check the color or your fingers every couple of hours.  Call your surgeon with the appearance of any discoloration (white or blue). c. If you are given a sling to wear before you go home, please wear it  at all times until the block has completely worn off.  Do not get up at night without your sling. d. Please contact [facility]est[facility] or your surgeon if you do not begin to regain sensation after 7 days from the surgery.  Anesthesia may be contacted by calling the Same Day Surgery Department, Mon. through Fri., 6 am to 4 pm at [phone].   e. If you experience any other problems or concerns, please contact your surgeon's office. f. If you experience severe or prolonged shortness of breath go to the nearest emergency department.

## 2018-12-11 NOTE — Op Note (Addendum)
SURGERY DATE: 12/11/2018  PRE-OP DIAGNOSIS:  1. Right subacromial impingement 2. Right proximal biceps rupture 3. Right rotator cuff tear (supraspinatus and subscapularis)  POST-OP DIAGNOSIS: 1. Right subacromial impingement 2. Right proximal biceps rupture 3. Right rotator cuff tear (supraspinatus and subscapularis)   PROCEDURES:  1. Right arthroscopic rotator cuff repair (supraspinatus and subscapularis) 2. Right subacromial decompression 3. Right extensive debridement of shoulder (glenohumeral and subacromial spaces)  SURGEON: Rosealee Albee, MD  ASSISTANT: Sonny Dandy, PA  ANESTHESIA: Gen with Exparil interscalene block  ESTIMATED BLOOD LOSS: 5cc  DRAINS:  none  TOTAL IV FLUIDS: per anesthesia   SPECIMENS: none  IMPLANTS:  - Arthrex 4.29mm SwiveLock - x 5 - Arthrex 5.43mm SwiveLock - x1  OPERATIVE FINDINGS:  Examination under anesthesia: A careful examination under anesthesia was performed.  Passive range of motion was: FF: 160; ER at side: 60; ER in abduction: 90; IR in abduction: 50.  Anterior load shift: 1+.  Posterior load shift: 1+.  Sulcus in neutral: NT.  Sulcus in ER: NT.    Intra-operative findings: A thorough arthroscopic examination of the shoulder was performed.  The findings are: 1. Biceps tendon: Not visualized in the intra-articular space 2. Superior labrum: injected with surrounding synovitis 3. Posterior labrum and capsule: normal 4. Inferior capsule and inferior recess: normal 5. Glenoid cartilage surface: normal 6. Supraspinatus attachment: Full-thickness tear of the anterior supraspinatus 7. Posterior rotator cuff attachment: Intact 8. Humeral head articular cartilage: normal 9. Rotator interval: significant synovitis 10: Subscapularis tendon: Full-thickness tear 11. Anterior labrum: Injected with surrounding synovitis 12. IGHL: normal  OPERATIVE REPORT:   Indications for procedure: Donald Mariner Sr. is a 68 y.o. male with ~2.5 months  of right shoulder pain that began after he attempted to start a chainsaw.  Clinical exam and MRI findings were consistent with subscapularis tear, supraspinatus tear, proximal biceps rupture, and subacromial bursitis/shoulder impingement. After discussion of risks, benefits, and alternatives to surgery, the patient elected to proceed.   Procedure in detail:  I identified Donald Mariner Sr. in the pre-operative holding area.  I marked the operative shoulder with my initials. I reviewed the risks and benefits of the proposed surgical intervention, and the patient wished to proceed.  Anesthesia was then performed with an interscalene block with Exparel.  The patient was placed in the beach chair position.    Appropriate IV antibiotics were administered prior to incision. The operative upper extremity was then prepped and draped in standard fashion. A time out was performed confirming the correct extremity, correct patient, and correct procedure.   I then created a standard posterior portal with an 11 blade. The glenohumeral joint was easily entered with a blunt trochar and the arthroscope introduced. The findings of diagnostic arthroscopy are described above. I debrided degenerative tissue including the synovitic tissue about the rotator interval, anterior labrum, and superior labrum. I then coagulated the inflamed synovium to obtain hemostasis and reduce the risk of post-operative swelling using an Arthrocare radiofrequency device.  The subscapularis tear was identified.  The comma tissue was identified, which corresponded to the superolateral border of the subscapularis.  Tissue was released both anteriorly and posteriorly to allow for improved mobilization.  An accessory anterolateral portal was made 1 cm off of the anterolateral corner of the acromion.  The comma tissue was tagged with a suture, and this was used as a traction stitch.  This allowed for improved visualization of the subscapularis  tendon itself.  A scorpion suture passing device  was used to pass 2 FiberTape sutures through the subscapularis tendon, each in a mattress fashion.  One FiberTape was passed through a 4.75 mm SwiveLock and placed into the inferior aspect of lesser tuberosity at the footprint of the subscapularis tendon with the arm in a neutral position.  The other FiberTape was placed in a similar fashion into the superior aspect of the lesser tuberosity footprint.  This construct allowed for excellent reapproximation and tension of the subscapularis tendon.  The arm was then internally and externally rotated and the subscapularis was noted to move appropriately with rotation.  Next, the arthroscope was introduced into the subacromial space. A direct lateral portal was created with an 11-blade after spinal needle localization. An extensive subacromial bursectomy was performed using a combination of the shaver and Arthrocare wand. This extended medially over the rotator cuff anteriorly and superiorly. The entire acromial undersurface was exposed and the CA ligament was subperiosteally elevated to expose the anterior acromial hook. A 5.78mm shaver on burr mode was used to create a flat anterior and lateral aspect of the acromion, converting it from a Type 2 to a Type 1 acromion.  The edges of the torn rotator cuff were gently trimmed with the shaver. A grasper was used to assess mobility of the rotator cuff and it was found to reduce well and cover the entire footprint.  It was an L-shaped tear with the longitudinal limb anterior.  Given this, I elected to proceed with the rotator cuff repair.  I then percutaneously placed two medial row anchors (Arthrex 4.75 mm SwiveLocks with FiberTape) for a speed bridge construct. These were placed at the anterior and posterior borders of the tear, at the articular margin. I then passed the tape from each anchor through the rotator cuff just lateral to the musculotendinous junction, one  anteriorly and one posteriorly.  I then took one tape limb from each of the 2 tapes in each anchor and fixed them 2cm distal to the tip of the greater tuberosity in line with the anteromedial and posteromedial anchors to create a crossed, double row suture configuration. Lateral row fixation was obtained with one 5.5 mm and one 4.75 mm Arthrex SwiveLock anchor. Tension in the tapes was adjusted and the inner locking mechanism of the anchors deployed. The FiberWire sutures from the anterior anchor was also passed through the cuff in a horizontal mattress fashion and tied arthroscopically to further enhance and secure fixation. This afforded homogeneous compression of the tendon across the prepared footprint.    Fluid was evacuated from the shoulder, and the portals were closed with 3-0 Nylon. Xeroform was applied to the portals. A sterile dressing was applied, followed by a Polar Care sleeve and a SlingShot shoulder immobilizer/sling. The patient was awakened from anesthesia without difficulty and was transferred to the PACU in stable condition.   Additionally, this case had increased complexity compared to standard arthroscopic rotator cuff repair given the involvement of the subscapularis tear. Repair of this tear increased surgical time by ~60 minutes and increased complexity due to additional preparation and repair of subscapularis tear, use of additional implants, and creation of an accessory portal, all of which would have otherwise not occurred.  Of note, assistance from a PA was essential to performing the surgery. PA assisted with patient positioning, retraction, and instrumentation. The surgery would have been more difficult and had longer operative time without PA assistance.    COMPLICATIONS: none  DISPOSITION: plan for discharge home after recovery in PACU  POSTOPERATIVE PLAN: Remain in sling (except hygiene and elbow/wrist/hand RoM exercises as instructed by PT) x 6 weeks and NWB for  this time. PT to begin 3-4 days after surgery. Large rotator cuff repair with subscapularis repair rehab protocol.  ASA for DVT prophylaxis.

## 2018-12-11 NOTE — Anesthesia Postprocedure Evaluation (Signed)
Anesthesia Post Note  Patient: Kathe Mariner Sr.  Procedure(s) Performed: SHOULDER ARTHROSCOPY VS MINI OPEN ROTATOR CUFF REPAIRWITH SUBACROMIAL DECOMPRESSION AND DISTAL CLAVICLE EXCISION BICEPS TENODESIS (Right Shoulder)  Patient location during evaluation: PACU Anesthesia Type: General Level of consciousness: awake and alert Pain management: pain level controlled Vital Signs Assessment: post-procedure vital signs reviewed and stable Respiratory status: spontaneous breathing, nonlabored ventilation, respiratory function stable and patient connected to nasal cannula oxygen Cardiovascular status: blood pressure returned to baseline and stable Postop Assessment: no apparent nausea or vomiting Anesthetic complications: no     Last Vitals:  Vitals:   12/11/18 1740 12/11/18 1801  BP: (!) 138/48 (!) 142/59  Pulse: 65 67  Resp: 17 16  Temp: 36.5 C (!) 36.3 C  SpO2: 92% 100%    Last Pain:  Vitals:   12/11/18 1801  TempSrc: Temporal  PainSc: 0-No pain                 Cleda Mccreedy Harveer Sadler

## 2018-12-11 NOTE — Anesthesia Post-op Follow-up Note (Signed)
Anesthesia QCDR form completed.        

## 2018-12-11 NOTE — Anesthesia Procedure Notes (Signed)
Anesthesia Regional Block: Interscalene brachial plexus block   Pre-Anesthetic Checklist: ,, timeout performed, Correct Patient, Correct Site, Correct Laterality, Correct Procedure, Correct Position, site marked, Risks and benefits discussed,  Surgical consent,  Pre-op evaluation,  At surgeon's request and post-op pain management  Laterality: Right  Prep: chloraprep       Needles:  Injection technique: Single-shot  Needle Type: Echogenic Stimulator Needle     Needle Length: 10cm  Needle Gauge: 20     Additional Needles:   Procedures:, nerve stimulator,,, ultrasound used (permanent image in chart),,,,   Nerve Stimulator or Paresthesia:  Response: biceps flexion,   Additional Responses:   Narrative:  Start time: 12/11/2018 10:25 AM Injection made incrementally with aspirations every 5 mL.  Performed by: Personally   Additional Notes: Functioning IV was confirmed and monitors were applied.  . Sterile prep and drape,hand hygiene and sterile gloves were used.  Negative aspiration and negative test dose prior to incremental administration of local anesthetic. The patient tolerated the procedure well.

## 2018-12-11 NOTE — H&P (Signed)
Paper H&P to be scanned into permanent record. H&P reviewed. No significant changes noted.  

## 2018-12-11 NOTE — Anesthesia Preprocedure Evaluation (Signed)
Anesthesia Evaluation  Patient identified by MRN, date of birth, ID band Patient awake    Reviewed: Allergy & Precautions, H&P , NPO status , Patient's Chart, lab work & pertinent test results, reviewed documented beta blocker date and time   Airway Mallampati: II  TM Distance: >3 FB Neck ROM: full    Dental  (+) Teeth Intact, Upper Dentures   Pulmonary neg pulmonary ROS, former smoker,    Pulmonary exam normal        Cardiovascular Exercise Tolerance: Good negative cardio ROS Normal cardiovascular exam Rhythm:regular Rate:Normal     Neuro/Psych negative neurological ROS  negative psych ROS   GI/Hepatic negative GI ROS, Neg liver ROS,   Endo/Other  negative endocrine ROSdiabetes, Well Controlled, Type 2, Oral Hypoglycemic Agents  Renal/GU negative Renal ROS  negative genitourinary   Musculoskeletal   Abdominal   Peds  Hematology negative hematology ROS (+)   Anesthesia Other Findings Past Medical History: No date: Abnormal bilirubin test     Comment:  ELEVATED BILIRIBIN 1.7 12/11/198 No date: Diabetes mellitus without complication (HCC) No date: HOH (hard of hearing)     Comment:  uses hearing aides Past Surgical History: 1997: ctr; Bilateral 1999: KNEE ARTHROSCOPY; Left 2009: SHOULDER ARTHROSCOPY; Left     Comment:  X 2 BMI    Body Mass Index:  31.79 kg/m     Reproductive/Obstetrics negative OB ROS                             Anesthesia Physical Anesthesia Plan  ASA: II  Anesthesia Plan: General ETT   Post-op Pain Management:  Regional for Post-op pain   Induction:   PONV Risk Score and Plan: 3  Airway Management Planned:   Additional Equipment:   Intra-op Plan:   Post-operative Plan:   Informed Consent: I have reviewed the patients History and Physical, chart, labs and discussed the procedure including the risks, benefits and alternatives for the proposed  anesthesia with the patient or authorized representative who has indicated his/her understanding and acceptance.   Dental Advisory Given  Plan Discussed with: CRNA  Anesthesia Plan Comments:         Anesthesia Quick Evaluation

## 2022-04-19 ENCOUNTER — Other Ambulatory Visit (HOSPITAL_COMMUNITY): Payer: Self-pay | Admitting: Orthopedic Surgery

## 2022-04-19 ENCOUNTER — Other Ambulatory Visit: Payer: Self-pay | Admitting: Orthopedic Surgery

## 2022-04-19 DIAGNOSIS — M25561 Pain in right knee: Secondary | ICD-10-CM

## 2022-04-25 ENCOUNTER — Ambulatory Visit
Admission: RE | Admit: 2022-04-25 | Discharge: 2022-04-25 | Disposition: A | Discharge status: Home / Self Care | Payer: Medicare Other | Source: Ambulatory Visit | Attending: Orthopedic Surgery | Admitting: Orthopedic Surgery

## 2022-04-25 DIAGNOSIS — M25561 Pain in right knee: Secondary | ICD-10-CM | POA: Diagnosis not present

## 2023-02-24 IMAGING — MR MR KNEE*R* W/O CM
6 series · 40 of 40 positions shown · non-contrast
Comparison: MRI right knee 06/26/2010

CLINICAL DATA: Pain and swelling for 1 month.

EXAM:
MRI OF THE RIGHT KNEE WITHOUT CONTRAST
TECHNIQUE: Multiplanar, multisequence MR imaging of the knee was performed. No
intravenous contrast was administered.

[Series 8: T2 fat-sat · axial · right · 4.0mm · 0.50mm/px · z∈[-82,+43]mm · 5 of 26 slices shown (1 of 3)]
[im 1/26]
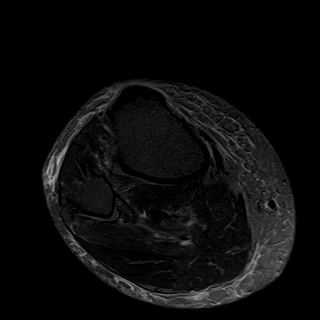
[im 7/26]
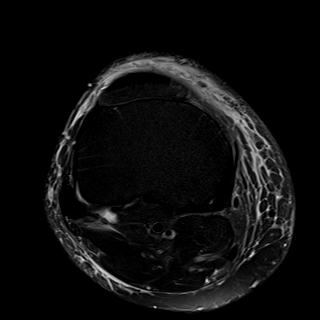
[im 13/26]
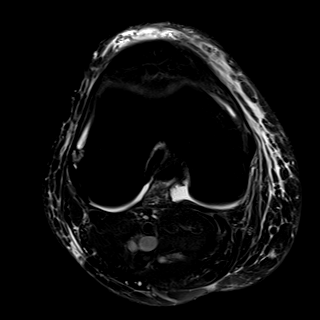
[im 19/26]
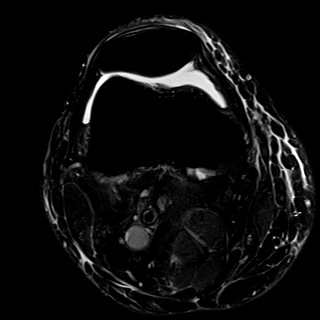
[im 26/26]
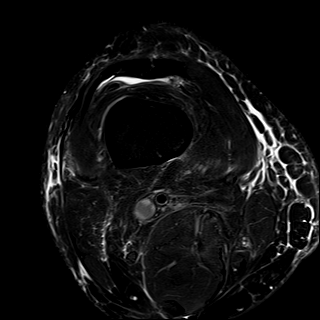

[Series 9: T2 fat-sat · coronal · right · 4.0mm · 0.59mm/px · 7 of 30 slices shown (2 of 3)]
[im 1/30]
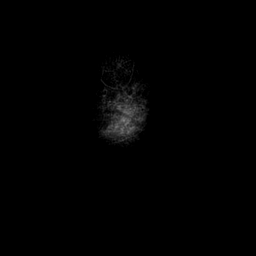
[im 5/30]
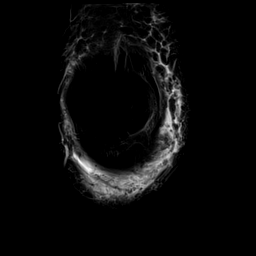
[im 10/30]
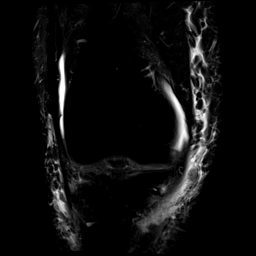
[im 15/30]
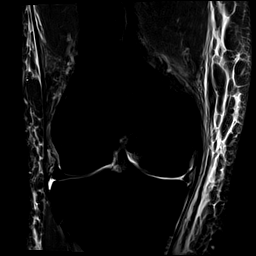
[im 20/30]
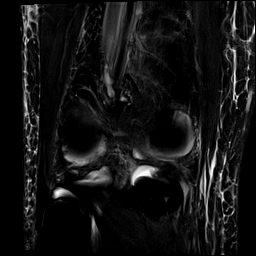
[im 25/30]
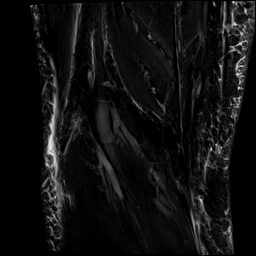
[im 30/30]
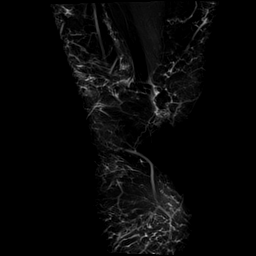

[Series 10: T1 · coronal · right · 4.0mm · 0.59mm/px · 7 of 30 slices shown]
[im 1/30]
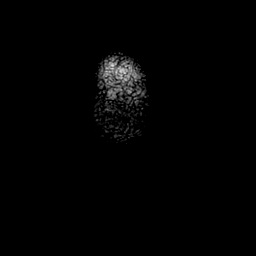
[im 5/30]
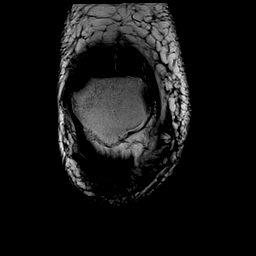
[im 10/30]
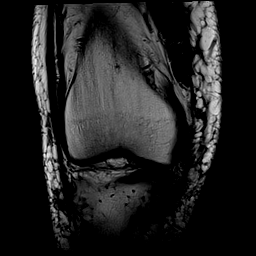
[im 15/30]
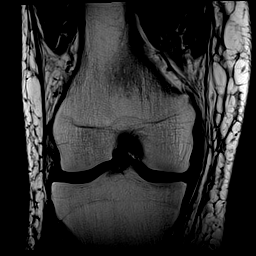
[im 20/30]
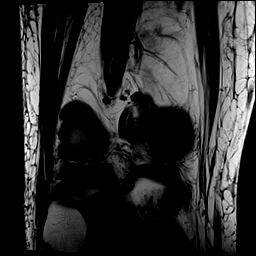
[im 25/30]
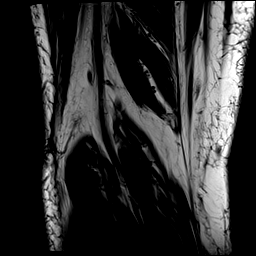
[im 30/30]
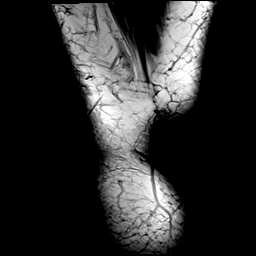

[Series 11: PD fat-sat · coronal · right · 4.0mm · 0.59mm/px · 7 of 30 slices shown (1 of 2)]
[im 1/30]
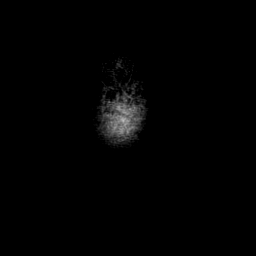
[im 5/30]
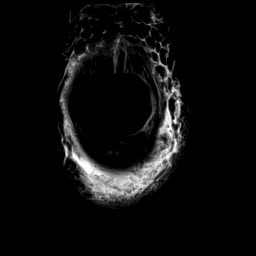
[im 10/30]
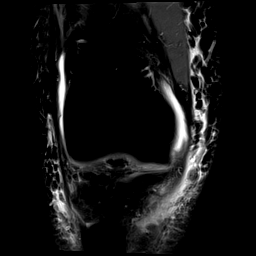
[im 15/30]
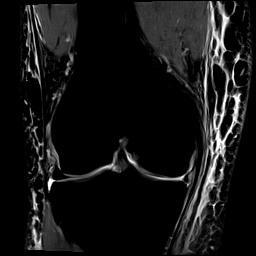
[im 20/30]
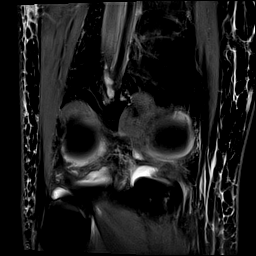
[im 25/30]
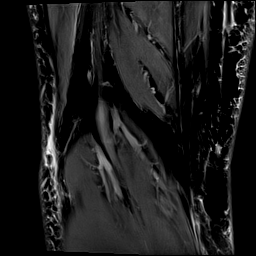
[im 30/30]
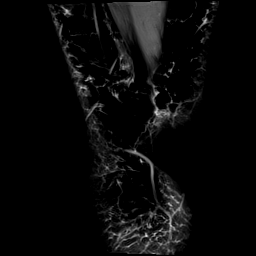

[Series 12: PD fat-sat · sagittal · right · 3.0mm · 0.62mm/px · 7 of 33 slices shown (2 of 2)]
[im 1/33]
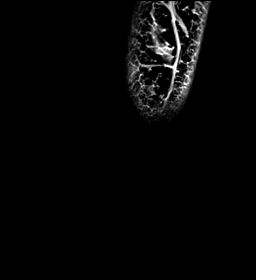
[im 6/33]
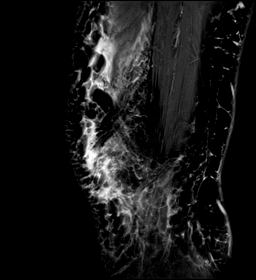
[im 11/33]
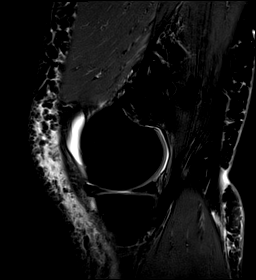
[im 17/33]
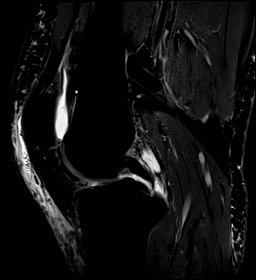
[im 22/33]
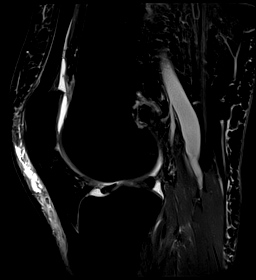
[im 27/33]
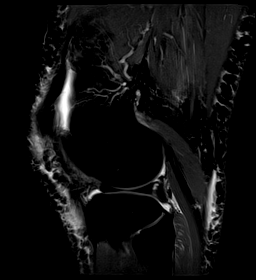
[im 33/33]
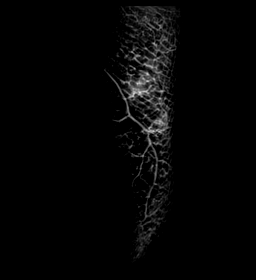

[Series 13: T2 fat-sat · sagittal · right · 3.0mm · 0.59mm/px · 7 of 34 slices shown (3 of 3)]
[im 1/34]
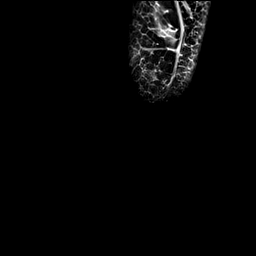
[im 6/34]
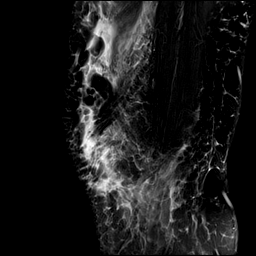
[im 12/34]
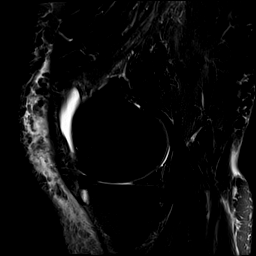
[im 17/34]
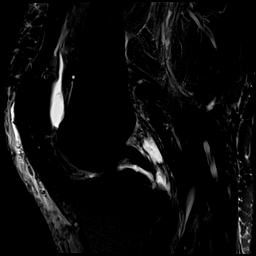
[im 23/34]
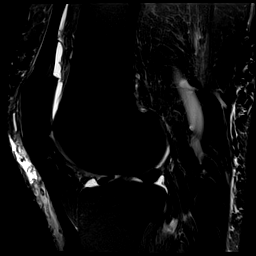
[im 28/34]
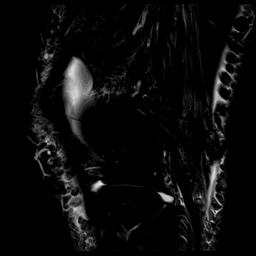
[im 34/34]
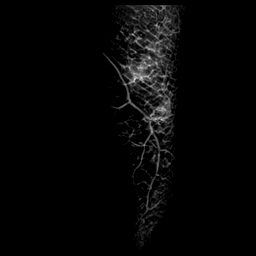

[40 of 40 positions shown; findings below may reference images not displayed]

FINDINGS: MENISCI

Medial meniscus: There is an oblique tear extending through the
inferior articular surface of the central third of the posterior
horn of the medial meniscus (sagittal series 12, images 21-24),
present previously but now with minimally more irregular borders.
Attenuation of the body of the medial meniscus, high-grade at the
mid AP dimension. Moderate posterior and high-grade anterior
intermediate proton density signal intrasubstance degeneration with
likely superior articular surface degenerative fraying of the
anterior segment of the body of the medial meniscus (coronal series
11 images 17 and 18).

Lateral meniscus:  Intact.

LIGAMENTS

Cruciates: The ACL and PCL are intact.

Collaterals: The medial collateral ligament is intact. The fibular
collateral ligament, biceps femoris tendon, iliotibial band, and
popliteus tendon are intact.

CARTILAGE

Patellofemoral: Mild surface irregularity of the patellar apex
cartilage slightly inferiorly (axial image 9).

Medial: Mild thinning of the lateral aspect of the weight-bearing
medial femoral condyle cartilage.

Lateral: Mild surface irregularity of the lateral tibial plateau
cartilage.

Joint: Tinyjoint effusion. Normal Hoffa's fat pad. No plical
thickening.

Popliteal Fossa:  No Baker's cyst.

Extensor Mechanism:  Intact quadriceps tendon and patellar tendon.

Bones:  No acute fracture or dislocation.

Other: There is mild edema and swelling of the anterior knee
subcutaneous fat with mild fluid within the subcutaneous fat
anterior to the inferior aspect of the patella on the proximal
patellar tendon possibly mild prepatellar bursitis.
IMPRESSION: Compared to 06/26/2010:

1. There is irregularity of the previously seen chronic central
undersurface tear of the posterior horn of the medial meniscus.
Possible interval partial meniscectomy of the body greater than
posterior horn of the medial meniscus with high-grade attenuation of
the mid AP dimension of the body of the medial meniscus.
2. Mild tricompartmental cartilage degenerative changes.
3. Tiny joint effusion.
4. Mild edema and swelling of the anterior knee subcutaneous fat
with mild fluid within the subcutaneous fat anterior to the inferior
patella and proximal patellar tendon. This may represent mild
prepatellar bursitis. Resolution of the prior higher grade fluid
within the anterolateral knee subcutaneous fat superficial to the
lateral portion of the patellar retinaculum.

## 2024-08-08 ENCOUNTER — Other Ambulatory Visit: Payer: Self-pay | Admitting: Nurse Practitioner

## 2024-08-08 DIAGNOSIS — R7989 Other specified abnormal findings of blood chemistry: Secondary | ICD-10-CM

## 2024-08-08 DIAGNOSIS — K529 Noninfective gastroenteritis and colitis, unspecified: Secondary | ICD-10-CM

## 2024-08-08 DIAGNOSIS — R1013 Epigastric pain: Secondary | ICD-10-CM

## 2024-08-09 ENCOUNTER — Ambulatory Visit
Admission: RE | Admit: 2024-08-09 | Discharge: 2024-08-09 | Disposition: A | Discharge status: Home / Self Care | Source: Ambulatory Visit | Attending: Nurse Practitioner | Admitting: Nurse Practitioner

## 2024-08-09 DIAGNOSIS — K529 Noninfective gastroenteritis and colitis, unspecified: Secondary | ICD-10-CM | POA: Insufficient documentation

## 2024-08-09 DIAGNOSIS — R1013 Epigastric pain: Secondary | ICD-10-CM | POA: Diagnosis present

## 2024-08-09 DIAGNOSIS — R7989 Other specified abnormal findings of blood chemistry: Secondary | ICD-10-CM | POA: Diagnosis present

## 2024-08-13 NOTE — Progress Notes (Signed)
 MRI abd orders written.  See internal communication from same DOS.  CCM time 10 min.

## 2024-08-16 NOTE — Progress Notes (Signed)
 Ordered/scheduled labs Ccm on lab

## 2024-08-17 NOTE — Progress Notes (Signed)
 Lab results   Leodis Sawyer, RMA    08/16/24 11:03 AM Result Note Patient notified, verbalized understanding. No questions or concerns   CCM 6 mins Dr Jeffie 3 mins   Comprehensive Metabolic Panel (CMP); CBC w/auto Differential (3 Part)

## 2024-08-17 NOTE — Progress Notes (Signed)
 Error

## 2024-08-17 NOTE — Progress Notes (Signed)
 This outpatient requested anxiolysis. Reported anxiety related to radiology encounter.   Criteria for radiology standing orders using midazolam  met.  The patient was monitored following administration of midazolam  (versed ) 2 mg IV without progressing signs or symptoms of anxiety. No initial adverse or allergic reactions to medication noted by this RN.  Patient will be released to home following radiology encounter with driver.

## 2024-08-18 ENCOUNTER — Encounter: Payer: Self-pay | Admitting: Emergency Medicine

## 2024-08-18 ENCOUNTER — Other Ambulatory Visit: Payer: Self-pay

## 2024-08-18 ENCOUNTER — Inpatient Hospital Stay
Admission: EM | Admit: 2024-08-18 | Discharge: 2024-08-21 | DRG: 417 | Disposition: A | Discharge status: Home / Self Care | Attending: Internal Medicine | Admitting: Internal Medicine

## 2024-08-18 DIAGNOSIS — K822 Perforation of gallbladder: Secondary | ICD-10-CM | POA: Diagnosis present

## 2024-08-18 DIAGNOSIS — Z79899 Other long term (current) drug therapy: Secondary | ICD-10-CM

## 2024-08-18 DIAGNOSIS — E66811 Obesity, class 1: Secondary | ICD-10-CM | POA: Diagnosis present

## 2024-08-18 DIAGNOSIS — Z888 Allergy status to other drugs, medicaments and biological substances status: Secondary | ICD-10-CM | POA: Diagnosis not present

## 2024-08-18 DIAGNOSIS — E7800 Pure hypercholesterolemia, unspecified: Secondary | ICD-10-CM | POA: Diagnosis present

## 2024-08-18 DIAGNOSIS — R5381 Other malaise: Secondary | ICD-10-CM

## 2024-08-18 DIAGNOSIS — Z7984 Long term (current) use of oral hypoglycemic drugs: Secondary | ICD-10-CM | POA: Diagnosis not present

## 2024-08-18 DIAGNOSIS — Z87891 Personal history of nicotine dependence: Secondary | ICD-10-CM

## 2024-08-18 DIAGNOSIS — K82A1 Gangrene of gallbladder in cholecystitis: Secondary | ICD-10-CM | POA: Diagnosis present

## 2024-08-18 DIAGNOSIS — E1165 Type 2 diabetes mellitus with hyperglycemia: Secondary | ICD-10-CM | POA: Diagnosis present

## 2024-08-18 DIAGNOSIS — I10 Essential (primary) hypertension: Secondary | ICD-10-CM | POA: Diagnosis present

## 2024-08-18 DIAGNOSIS — Z6831 Body mass index (BMI) 31.0-31.9, adult: Secondary | ICD-10-CM | POA: Diagnosis not present

## 2024-08-18 DIAGNOSIS — K8000 Calculus of gallbladder with acute cholecystitis without obstruction: Secondary | ICD-10-CM | POA: Diagnosis present

## 2024-08-18 DIAGNOSIS — E785 Hyperlipidemia, unspecified: Secondary | ICD-10-CM | POA: Diagnosis present

## 2024-08-18 DIAGNOSIS — E119 Type 2 diabetes mellitus without complications: Secondary | ICD-10-CM

## 2024-08-18 DIAGNOSIS — K810 Acute cholecystitis: Secondary | ICD-10-CM | POA: Diagnosis not present

## 2024-08-18 LAB — COMPREHENSIVE METABOLIC PANEL WITH GFR
ALT: 56 U/L — ABNORMAL HIGH (ref 0–44)
AST: 23 U/L (ref 15–41)
Albumin: 3.2 g/dL — ABNORMAL LOW (ref 3.5–5.0)
Alkaline Phosphatase: 94 U/L (ref 38–126)
Anion gap: 13 (ref 5–15)
BUN: 14 mg/dL (ref 8–23)
CO2: 26 mmol/L (ref 22–32)
Calcium: 8.3 mg/dL — ABNORMAL LOW (ref 8.9–10.3)
Chloride: 100 mmol/L (ref 98–111)
Creatinine, Ser: 0.86 mg/dL (ref 0.61–1.24)
GFR, Estimated: >60 mL/min (ref >60)
Glucose, Bld: 250 mg/dL — ABNORMAL HIGH (ref 70–99)
Potassium: 4.1 mmol/L (ref 3.5–5.1)
Sodium: 139 mmol/L (ref 135–145)
Total Bilirubin: 1.6 mg/dL — ABNORMAL HIGH (ref 0.0–1.2)
Total Protein: 6.4 g/dL — ABNORMAL LOW (ref 6.5–8.1)

## 2024-08-18 LAB — GLUCOSE, CAPILLARY
Glucose-Capillary: 166 mg/dL — ABNORMAL HIGH (ref 70–99)
Glucose-Capillary: 216 mg/dL — ABNORMAL HIGH (ref 70–99)

## 2024-08-18 LAB — HEMOGLOBIN A1C
Hgb A1c MFr Bld: 7 % — ABNORMAL HIGH (ref 4.8–5.6)
Mean Plasma Glucose: 154.2 mg/dL

## 2024-08-18 LAB — CBC
HCT: 43.8 % (ref 39.0–52.0)
Hemoglobin: 15 g/dL (ref 13.0–17.0)
MCH: 29.4 pg (ref 26.0–34.0)
MCHC: 34.2 g/dL (ref 30.0–36.0)
MCV: 85.7 fL (ref 80.0–100.0)
Platelets: 385 K/uL (ref 150–400)
RBC: 5.11 MIL/uL (ref 4.22–5.81)
RDW: 12.9 % (ref 11.5–15.5)
WBC: 9.8 K/uL (ref 4.0–10.5)
nRBC: 0 % (ref 0.0–0.2)

## 2024-08-18 LAB — URINALYSIS, ROUTINE W REFLEX MICROSCOPIC
Bacteria, UA: NONE SEEN
Bilirubin Urine: NEGATIVE
Glucose, UA: >=500 mg/dL — AB
Hgb urine dipstick: NEGATIVE
Ketones, ur: NEGATIVE mg/dL
Leukocytes,Ua: NEGATIVE
Nitrite: NEGATIVE
Protein, ur: NEGATIVE mg/dL
Specific Gravity, Urine: 1.022 (ref 1.005–1.030)
Squamous Epithelial / HPF: 0 /HPF (ref 0–5)
pH: 5 (ref 5.0–8.0)

## 2024-08-18 LAB — LIPASE, BLOOD: Lipase: 46 U/L (ref 11–51)

## 2024-08-18 MED ORDER — FUROSEMIDE 20 MG PO TABS: 20.0000 mg | ORAL_TABLET | Freq: Every day | ORAL | Status: DC | PRN

## 2024-08-18 MED ORDER — PIPERACILLIN-TAZOBACTAM 3.375 G IVPB 30 MIN
3.3750 g | Freq: Once | INTRAVENOUS | Status: DC
Start: 2024-08-18 — End: 2024-08-18

## 2024-08-18 MED ORDER — INSULIN ASPART 100 UNIT/ML IJ SOLN
0.0000 [IU] | Freq: Three times a day (TID) | INTRAMUSCULAR | Status: DC
  Administered 2024-08-18: 2 [IU] via SUBCUTANEOUS
  Administered 2024-08-19: 3 [IU] via SUBCUTANEOUS
  Filled 2024-08-18 (×2): qty 1

## 2024-08-18 MED ORDER — PANTOPRAZOLE SODIUM 40 MG IV SOLR
40.0000 mg | Freq: Two times a day (BID) | INTRAVENOUS | Status: DC
  Administered 2024-08-18 – 2024-08-21 (×6): 40 mg via INTRAVENOUS
  Filled 2024-08-18 (×6): qty 10

## 2024-08-18 MED ORDER — ACETAMINOPHEN 325 MG PO TABS
650.0000 mg | ORAL_TABLET | Freq: Four times a day (QID) | ORAL | Status: DC | PRN
  Administered 2024-08-20 (×2): 650 mg via ORAL
  Filled 2024-08-18 (×2): qty 2

## 2024-08-18 MED ORDER — ONDANSETRON HCL 4 MG PO TABS: 4.0000 mg | ORAL_TABLET | Freq: Four times a day (QID) | ORAL | Status: DC | PRN

## 2024-08-18 MED ORDER — POLYETHYLENE GLYCOL 3350 17 G PO PACK: 17.0000 g | PACK | Freq: Every day | ORAL | Status: DC | PRN

## 2024-08-18 MED ORDER — ACETAMINOPHEN 650 MG RE SUPP: 650.0000 mg | Freq: Four times a day (QID) | RECTAL | Status: DC | PRN

## 2024-08-18 MED ORDER — ONDANSETRON HCL 4 MG/2ML IJ SOLN: 4.0000 mg | Freq: Four times a day (QID) | INTRAMUSCULAR | Status: DC | PRN

## 2024-08-18 MED ORDER — PIPERACILLIN-TAZOBACTAM 3.375 G IVPB
3.3750 g | Freq: Three times a day (TID) | INTRAVENOUS | Status: DC
  Administered 2024-08-18 – 2024-08-21 (×9): 3.375 g via INTRAVENOUS
  Filled 2024-08-18 (×9): qty 50

## 2024-08-18 MED ORDER — MORPHINE SULFATE (PF) 2 MG/ML IV SOLN
2.0000 mg | INTRAVENOUS | Status: DC | PRN
  Administered 2024-08-19: 2 mg via INTRAVENOUS
  Filled 2024-08-18: qty 1

## 2024-08-18 MED ORDER — INSULIN ASPART 100 UNIT/ML IJ SOLN
0.0000 [IU] | Freq: Every day | INTRAMUSCULAR | Status: DC
  Administered 2024-08-18: 2 [IU] via SUBCUTANEOUS
  Filled 2024-08-18: qty 1

## 2024-08-18 MED ORDER — ENOXAPARIN SODIUM 60 MG/0.6ML IJ SOSY
0.5000 mg/kg | PREFILLED_SYRINGE | INTRAMUSCULAR | Status: DC
  Administered 2024-08-18 – 2024-08-20 (×3): 50 mg via SUBCUTANEOUS
  Filled 2024-08-18 (×3): qty 0.6

## 2024-08-18 NOTE — ED Provider Notes (Signed)
 [facility] Provider Note    Event Date/Time   First MD Initiated Contact with Patient 08/18/24 1407     (approximate)   History   abnormal MRI   HPI  Donald Carelock Sr. is a 73 y.o. male with a history of hypertension, high cholesterol, and diabetes who presents with right upper quadrant pain and nausea over the last 2 weeks.  The patient states that initially around Labor Day weekend he started to have more severe right upper quadrant pain, nausea, and vomiting.  The symptoms lasted for several days and started to subside.  He had an outpatient ultrasound last week, and then an MRI last night.  He was called this morning and told to come into the ED.  He reports his pain as a 3/10 currently.  He has minimal nausea at this time.  He states that he had some blood in his stool when the symptoms for started but is not having this anymore.  He denies any fever.  I reviewed the past medical records for the patient's most recent prior outpatient encounter with a full note is from 9/2 with family medicine for nausea and vomiting as well as some diarrhea.  The patient had an abdominal ultrasound on 9/4 which showed cholelithiasis and equivocal findings for acute cholecystitis.  MRI performed yesterday showed the following:  Impression:   1. Cholelithiasis with acute cholecystitis. Multifocal irregularity and  ulceration of the gallbladder wall concerning for gangrenous cholecystitis.  No organized pericholecystic or hepatic abscess to suggest frank  perforation.   2. Right inferior pole renal mass with extension into the right renal vein  to the level of the renal vein/IVC junction consistent with renal cell  carcinoma. No evidence of distant metastatic disease within the abdomen.     Physical Exam   Triage Vital Signs: ED Triage Vitals  Encounter Vitals Group     BP 08/18/24 1230 (!) 145/68     Girls Systolic BP Percentile --      Girls Diastolic BP  Percentile --      Boys Systolic BP Percentile --      Boys Diastolic BP Percentile --      Pulse Rate 08/18/24 1230 78     Resp 08/18/24 1230 18     Temp 08/18/24 1230 98.2 F (36.8 C)     Temp src --      SpO2 08/18/24 1230 95 %     Weight 08/18/24 1230 219 lb (99.3 kg)     Height 08/18/24 1230 5' 10 (1.778 m)     Head Circumference --      Peak Flow --      Pain Score --      Pain Loc --      Pain Education --      Exclude from Growth Chart --     Most recent vital signs: Vitals:   08/18/24 1230 08/18/24 1415  BP: (!) 145/68   Pulse: 78   Resp: 18   Temp: 98.2 F (36.8 C)   SpO2: 95% 99%     General: Alert, relatively well-appearing, no distress.  CV:  Good peripheral perfusion.  Resp:  Normal effort.  Abd:  No distention.  Soft with mild right upper quadrant tenderness. Other:  No jaundice or scleral icterus.   ED Results / Procedures / Treatments   Labs (all labs ordered are listed, but only abnormal results are displayed) Labs Reviewed  COMPREHENSIVE METABOLIC PANEL WITH  GFR - Abnormal; Notable for the following components:      Result Value   Glucose, Bld 250 (*)    Calcium 8.3 (*)    Total Protein 6.4 (*)    Albumin 3.2 (*)    ALT 56 (*)    Total Bilirubin 1.6 (*)    All other components within normal limits  URINALYSIS, ROUTINE W REFLEX MICROSCOPIC - Abnormal; Notable for the following components:   Color, Urine AMBER (*)    APPearance CLEAR (*)    Glucose, UA >=500 (*)    All other components within normal limits  LIPASE, BLOOD  CBC     EKG    RADIOLOGY    PROCEDURES:  Critical Care performed: No  Procedures   MEDICATIONS ORDERED IN ED: Medications  piperacillin -tazobactam (ZOSYN ) IVPB 3.375 g (has no administration in time range)     IMPRESSION / MDM / ASSESSMENT AND PLAN / ED COURSE  I reviewed the triage vital signs and the nursing notes.  73 year old male with PMH as noted above presents with right upper quadrant  abdominal pain, nausea, and vomiting over the last 2 weeks, although the symptoms have somewhat subsided in the last week.  Outpatient MRI performed yesterday shows evidence of acute cholecystitis and cannot rule out a perforation.  The patient's physical exam is unremarkable.  He has very mild right upper quadrant tenderness with no peritoneal signs.  Differential diagnosis includes, but is not limited to, acute cholecystitis, chronic cholecystitis.  CMP and CBC show no acute findings.  Lipase is normal.  Urinalysis is unremarkable.  There is no indication for repeat imaging today.  I will consult general surgery.  Patient's presentation is most consistent with acute presentation with potential threat to life or bodily function.  ----------------------------------------- 3:09 PM on 08/18/2024 -----------------------------------------  I consulted Dr. Marinda from general surgery who agrees to evaluate the patient and advises that he will plan for a cholecystectomy tomorrow.  I then consulted Dr. Roann from the hospitalist service; based on our discussion he agrees to evaluate the patient for admission.   FINAL CLINICAL IMPRESSION(S) / ED DIAGNOSES   Final diagnoses:  Acute cholecystitis     Rx / DC Orders   ED Discharge Orders     None        Note:  This document was prepared using Dragon voice recognition software and may include unintentional dictation errors.    Jacolyn Pae, MD 08/18/24 1510

## 2024-08-18 NOTE — ED Triage Notes (Signed)
 PT had MRI at [facility] yesterday and was called today and told to come to ER due to sludge and stones seen on MRI. Denies complains of slight discomfort but no vomiting.

## 2024-08-18 NOTE — Progress Notes (Signed)
 PHARMACIST - PHYSICIAN COMMUNICATION  CONCERNING:  Enoxaparin  (Lovenox ) for DVT Prophylaxis   ASSESSMENT: Patient was prescribed enoxaparin  40 mg subcutaneously every 24 hours for VTE prophylaxis.   Body mass index is 31.42 kg/m.  Estimated Creatinine Clearance: 89 mL/min (by C-G formula based on SCr of 0.86 mg/dL).  Based on [facility] policy, patient qualifies for enoxaparin  dosing of 0.5 mg per kilogram of total body weight every 24 hours because their body mass index is >30 kg/m2.  PLAN: Pharmacy has adjusted enoxaparin  dose per [facility] policy.  Description: Patient is now receiving enoxaparin  50 mg subcutaneously every 24 hours.  Will M. Lenon, PharmD, BCPS Clinical Pharmacist 08/18/2024 3:21 PM

## 2024-08-18 NOTE — H&P (Signed)
 History and Physical    Donald Willis. FMW:[id number] DOB: Dec 02, 1951 DOA: 08/18/2024  DOS: the patient was seen and examined on 08/18/2024  PCP: Jeffie Cheryl BRAVO, MD   Patient coming from: Home  I have personally briefly reviewed patient's old medical records in [facility]  Chief Complaint: Abnormal MRI  HPI: Donald [facility] Willis. is a pleasant 73 y.o. male with medical history significant for diabetes, HTN, HLD, obesity who presented to [facility] for abnormal MRI which was done on outpatient basis for abdominal pain.  Patient stated that he had abdominal pain around Labor Day weekend in the right upper quadrant associated with some nausea and vomiting.  He stated that his pain was 7/10 in intensity around right upper quadrant, then he was evaluated with ultrasound which was not conclusive then advised to do the MRI of the abdomen.  He was called this morning and told to come to ED for abnormal MRI.  MRI showed multifocal irregularity and ulceration and gallbladder wall thickening concerning for gangrenous cholecystitis. Today he reported his pain is currently 3/10, has some associated nausea no vomiting.  He stated that his pain is sharp mainly on the right upper quadrant radiating to some back but no fever no chills no chest pain or palpitations.  He denies any shortness of breath.  ED Course: Upon arrival to the ED, patient is found to abdominal pain as mentioned above.  Surgical team was consulted for evaluation.  They advised medicine to admit the patient and they will see in consultation for laparoscopic cholecystectomy for tomorrow.  Review of Systems:  ROS  All other systems negative except as noted in the HPI.  Past Medical History:  Diagnosis Date   Abnormal bilirubin test    ELEVATED BILIRIBIN 1.7 12/11/198   Diabetes mellitus without complication (HCC)    HOH (hard of hearing)    uses hearing aides    Past Surgical History:  Procedure Laterality Date   ctr  Bilateral 1997   KNEE ARTHROSCOPY Left 1999   SHOULDER ARTHROSCOPY Left 2009   X 2     reports that he quit smoking about 32 years ago. His smoking use included cigarettes. He has never used smokeless tobacco. He reports that he does not drink alcohol and does not use drugs.  Allergies  Allergen Reactions   Metformin Rash    Adverse reaction to bowels    History reviewed. No pertinent family history.  Prior to Admission medications   Medication Sig Start Date End Date Taking? Authorizing Provider  atorvastatin (LIPITOR) 20 MG tablet Take 20 mg by mouth daily.  05/04/16   [provider]  benzonatate  (TESSALON  PERLES) 100 MG capsule Take 1 capsule (100 mg total) by mouth 3 (three) times daily as needed for cough. Patient not taking: Reported on 11/22/2018 04/04/17   Cleotilde Jacobsen, NP  chlorpheniramine-HYDROcodone (TUSSIONEX PENNKINETIC ER) 10-8 MG/5ML SUER Take 5 mLs by mouth at bedtime as needed for cough. do not drive or operate machinery while taking as can cause drowsiness. Patient not taking: Reported on 11/22/2018 04/04/17   Cleotilde Jacobsen, NP  furosemide  (LASIX ) 20 MG tablet Take 20 mg by mouth daily as needed for fluid or edema.  08/10/16   [provider]  glyBURIDE (DIABETA) 5 MG tablet Take 5 mg by mouth 2 (two) times daily with a meal.  10/30/16   [provider]  ondansetron  (ZOFRAN  ODT) 4 MG disintegrating tablet Take 1 tablet (4 mg total) by mouth  every 8 (eight) hours as needed for nausea or vomiting. 12/11/18   Tobie Priest, MD  pioglitazone (ACTOS) 45 MG tablet Take 45 mg by mouth daily.  12/09/15   [provider]  sildenafil (VIAGRA) 100 MG tablet TAKE 1 TABLET AS DIRECTED 12/27/16   [provider]    Physical Exam: Vitals:   08/18/24 1230 08/18/24 1230 08/18/24 1415  BP:  (!) 145/68   Pulse:  78   Resp:  18   Temp:  98.2 F (36.8 C)   SpO2:  95% 99%  Weight: 99.3 kg    Height: 5' 10 (1.778 m)      Physical Exam    Constitutional: Alert, awake, calm, comfortable HEENT: Neck supple Respiratory: Clear to auscultation B/L, no wheezing, no rales.  Cardiovascular: Regular rate and rhythm, no murmurs / rubs / gallops. No extremity edema. 2+ pedal pulses. No carotid bruits.  Abdomen: Soft, mild RUQ tenderness, Bowel sounds positive.  Musculoskeletal: no clubbing / cyanosis. Good ROM, no contractures. Normal muscle tone.  Skin: no rashes, lesions, ulcers. Neurologic: CN 2-12 grossly intact. Sensation intact, No focal deficit identified Psychiatric: Alert and oriented x 3. Normal mood.    Labs on Admission: I have personally reviewed following labs and imaging studies  CBC: Recent Labs  Lab 08/18/24 1232  WBC 9.8  HGB 15.0  HCT 43.8  MCV 85.7  PLT 385   Basic Metabolic Panel: Recent Labs  Lab 08/18/24 1232  NA 139  K 4.1  CL 100  CO2 26  GLUCOSE 250*  BUN 14  CREATININE 0.86  CALCIUM 8.3*   GFR: Estimated Creatinine Clearance: 89 mL/min (by C-G formula based on SCr of 0.86 mg/dL). Liver Function Tests: Recent Labs  Lab 08/18/24 1232  AST 23  ALT 56*  ALKPHOS 94  BILITOT 1.6*  PROT 6.4*  ALBUMIN 3.2*   Recent Labs  Lab 08/18/24 1232  LIPASE 46   No results for input(s): AMMONIA in the last 168 hours. Coagulation Profile: No results for input(s): INR, PROTIME in the last 168 hours. Cardiac Enzymes: No results for input(s): CKTOTAL, CKMB, CKMBINDEX, TROPONINI, TROPONINIHS in the last 168 hours. BNP (last 3 results) No results for input(s): BNP in the last 8760 hours. HbA1C: No results for input(s): HGBA1C in the last 72 hours. CBG: No results for input(s): GLUCAP in the last 168 hours. Lipid Profile: No results for input(s): CHOL, HDL, LDLCALC, TRIG, CHOLHDL, LDLDIRECT in the last 72 hours. Thyroid Function Tests: No results for input(s): TSH, T4TOTAL, FREET4, T3FREE, THYROIDAB in the last 72 hours. Anemia Panel: No results  for input(s): VITAMINB12, FOLATE, FERRITIN, TIBC, IRON, RETICCTPCT in the last 72 hours. Urine analysis:    Component Value Date/Time   COLORURINE AMBER (A) 08/18/2024 1232   APPEARANCEUR CLEAR (A) 08/18/2024 1232   LABSPEC 1.022 08/18/2024 1232   LABSPEC 1.020 12/14/2012 1110   PHURINE 5.0 08/18/2024 1232   GLUCOSEU >=500 (A) 08/18/2024 1232   GLUCOSEU >/= 2000 mg/dL 98/90/7985 8889   HGBUR NEGATIVE 08/18/2024 1232   BILIRUBINUR NEGATIVE 08/18/2024 1232   KETONESUR NEGATIVE 08/18/2024 1232   PROTEINUR NEGATIVE 08/18/2024 1232   NITRITE NEGATIVE 08/18/2024 1232   LEUKOCYTESUR NEGATIVE 08/18/2024 1232    Radiological Exams on Admission: I have personally reviewed images No results found.  EKG: N/A    Assessment/Plan Principal Problem:   Acute cholecystitis Active Problems:   Hyperlipidemia   HTN (hypertension)   Controlled type 2 diabetes mellitus without complication, without long-term current use  of insulin  (HCC)    Assessment and Plan: 73 year old male double/PMH of DM, HTN, HLD who had abdominal pain earlier this month.  He underwent MRI of the abdomen which showed acute cholecystitis and being admitted for laparoscopic cholecystectomy.  1.  Acute gangrenous cholecystitis - He will be admitted to hospital as inpatient - He will be given IV antibiotics with Zosyn , IV pain medication, IV fluid. - Surgical service consult was done they want to take him to the OR tomorrow - Pain medications - N.p.o. past midnight, IV fluid, IV Protonix  for GI prophylaxis  2.  Type 2 diabetes - Patient uses Trulicity and glyburide and pioglitazone - Will place him on insulin  sliding scale and adjust as needed  3.  HTN/HLD - Will hold statin at this point due to acute cholecystitis - Continue home medications Lasix  - Continue to monitor blood pressure   DVT prophylaxis: Lovenox  Code Status: Full Code Family Communication: Wife was at bedside Disposition Plan:  Home Consults called: General Surgery by ED Admission status: Inpatient, Med-Surg   Nena Rebel, MD [facility] 08/18/2024, 3:26 PM

## 2024-08-19 ENCOUNTER — Inpatient Hospital Stay: Admitting: General Practice

## 2024-08-19 ENCOUNTER — Encounter
Admission: EM | Disposition: A | Discharge status: Home / Self Care | Payer: Self-pay | Source: Home / Self Care | Attending: Internal Medicine

## 2024-08-19 ENCOUNTER — Other Ambulatory Visit: Payer: Self-pay

## 2024-08-19 ENCOUNTER — Inpatient Hospital Stay

## 2024-08-19 DIAGNOSIS — K810 Acute cholecystitis: Secondary | ICD-10-CM

## 2024-08-19 HISTORY — PX: INTRAOPERATIVE CHOLANGIOGRAM: SHX5230

## 2024-08-19 LAB — PROTIME-INR
INR: 1 (ref 0.8–1.2)
Prothrombin Time: 13.5 s (ref 11.4–15.2)

## 2024-08-19 LAB — CBC
HCT: 42 % (ref 39.0–52.0)
Hemoglobin: 14.5 g/dL (ref 13.0–17.0)
MCH: 29.2 pg (ref 26.0–34.0)
MCHC: 34.5 g/dL (ref 30.0–36.0)
MCV: 84.5 fL (ref 80.0–100.0)
Platelets: 356 K/uL (ref 150–400)
RBC: 4.97 MIL/uL (ref 4.22–5.81)
RDW: 13 % (ref 11.5–15.5)
WBC: 8.4 K/uL (ref 4.0–10.5)
nRBC: 0 % (ref 0.0–0.2)

## 2024-08-19 LAB — COMPREHENSIVE METABOLIC PANEL WITH GFR
ALT: 40 U/L (ref 0–44)
AST: 20 U/L (ref 15–41)
Albumin: 2.8 g/dL — ABNORMAL LOW (ref 3.5–5.0)
Alkaline Phosphatase: 81 U/L (ref 38–126)
Anion gap: 10 (ref 5–15)
BUN: 15 mg/dL (ref 8–23)
CO2: 26 mmol/L (ref 22–32)
Calcium: 7.9 mg/dL — ABNORMAL LOW (ref 8.9–10.3)
Chloride: 103 mmol/L (ref 98–111)
Creatinine, Ser: 0.93 mg/dL (ref 0.61–1.24)
GFR, Estimated: >60 mL/min (ref >60)
Glucose, Bld: 204 mg/dL — ABNORMAL HIGH (ref 70–99)
Potassium: 3.7 mmol/L (ref 3.5–5.1)
Sodium: 139 mmol/L (ref 135–145)
Total Bilirubin: 2.1 mg/dL — ABNORMAL HIGH (ref 0.0–1.2)
Total Protein: 5.9 g/dL — ABNORMAL LOW (ref 6.5–8.1)

## 2024-08-19 LAB — GLUCOSE, CAPILLARY
Glucose-Capillary: 204 mg/dL — ABNORMAL HIGH (ref 70–99)
Glucose-Capillary: 256 mg/dL — ABNORMAL HIGH (ref 70–99)
Glucose-Capillary: 318 mg/dL — ABNORMAL HIGH (ref 70–99)
Glucose-Capillary: 374 mg/dL — ABNORMAL HIGH (ref 70–99)

## 2024-08-19 SURGERY — CHOLECYSTECTOMY, ROBOT-ASSISTED, LAPAROSCOPIC
Anesthesia: General | Site: Abdomen

## 2024-08-19 MED ORDER — OXYCODONE HCL 5 MG PO TABS
5.0000 mg | ORAL_TABLET | Freq: Once | ORAL | Status: AC | PRN
  Administered 2024-08-19: 5 mg via ORAL

## 2024-08-19 MED ORDER — FENTANYL CITRATE (PF) 100 MCG/2ML IJ SOLN
INTRAMUSCULAR | Status: AC
  Filled 2024-08-19: qty 2

## 2024-08-19 MED ORDER — PHENYLEPHRINE 80 MCG/ML (10ML) SYRINGE FOR IV PUSH (FOR BLOOD PRESSURE SUPPORT)
PREFILLED_SYRINGE | INTRAVENOUS | Status: DC | PRN
  Administered 2024-08-19 (×2): 120 ug via INTRAVENOUS

## 2024-08-19 MED ORDER — FENTANYL CITRATE (PF) 100 MCG/2ML IJ SOLN
INTRAMUSCULAR | Status: DC | PRN
  Administered 2024-08-19 (×2): 50 ug via INTRAVENOUS

## 2024-08-19 MED ORDER — INSULIN ASPART 100 UNIT/ML IJ SOLN
INTRAMUSCULAR | Status: AC
  Filled 2024-08-19: qty 1

## 2024-08-19 MED ORDER — IOHEXOL 180 MG/ML  SOLN
INTRAMUSCULAR | Status: DC | PRN
Start: 2024-08-19 — End: 2024-08-19
  Administered 2024-08-19: 20 mL

## 2024-08-19 MED ORDER — INSULIN ASPART 100 UNIT/ML IJ SOLN
0.0000 [IU] | Freq: Three times a day (TID) | INTRAMUSCULAR | Status: DC
  Administered 2024-08-19: 11 [IU] via SUBCUTANEOUS
  Administered 2024-08-20: 5 [IU] via SUBCUTANEOUS
  Administered 2024-08-20 – 2024-08-21 (×4): 8 [IU] via SUBCUTANEOUS
  Filled 2024-08-19 (×6): qty 1

## 2024-08-19 MED ORDER — OXYCODONE HCL 5 MG/5ML PO SOLN: 5.0000 mg | Freq: Once | ORAL | Status: AC | PRN

## 2024-08-19 MED ORDER — INSULIN ASPART 100 UNIT/ML IJ SOLN
8.0000 [IU] | Freq: Once | INTRAMUSCULAR | Status: AC
  Administered 2024-08-19: 8 [IU] via SUBCUTANEOUS

## 2024-08-19 MED ORDER — BUPIVACAINE-EPINEPHRINE (PF) 0.5% -1:200000 IJ SOLN
INTRAMUSCULAR | Status: AC
  Filled 2024-08-19: qty 20

## 2024-08-19 MED ORDER — SUGAMMADEX SODIUM 200 MG/2ML IV SOLN
INTRAVENOUS | Status: DC | PRN
  Administered 2024-08-19: 200 mg via INTRAVENOUS

## 2024-08-19 MED ORDER — PROPOFOL 10 MG/ML IV BOLUS
INTRAVENOUS | Status: AC
Start: 2024-08-19 — End: 2024-08-19
  Filled 2024-08-19: qty 20

## 2024-08-19 MED ORDER — 0.9 % SODIUM CHLORIDE (POUR BTL) OPTIME
TOPICAL | Status: DC | PRN
  Administered 2024-08-19: 500 mL
  Administered 2024-08-19: 3000 mL

## 2024-08-19 MED ORDER — FENTANYL CITRATE (PF) 100 MCG/2ML IJ SOLN
25.0000 ug | INTRAMUSCULAR | Status: DC | PRN
  Administered 2024-08-19 (×2): 25 ug via INTRAVENOUS

## 2024-08-19 MED ORDER — ROCURONIUM BROMIDE 100 MG/10ML IV SOLN
INTRAVENOUS | Status: DC | PRN
  Administered 2024-08-19: 20 mg via INTRAVENOUS
  Administered 2024-08-19: 60 mg via INTRAVENOUS
  Administered 2024-08-19: 10 mg via INTRAVENOUS

## 2024-08-19 MED ORDER — SODIUM CHLORIDE 0.9 % IV SOLN: INTRAVENOUS | Status: DC | PRN

## 2024-08-19 MED ORDER — OXYCODONE HCL 5 MG PO TABS
ORAL_TABLET | ORAL | Status: AC
  Filled 2024-08-19: qty 1

## 2024-08-19 MED ORDER — ORAL CARE MOUTH RINSE: 15.0000 mL | OROMUCOSAL | Status: DC | PRN

## 2024-08-19 MED ORDER — OXYCODONE HCL 5 MG PO TABS
5.0000 mg | ORAL_TABLET | Freq: Four times a day (QID) | ORAL | Status: DC | PRN
  Administered 2024-08-19 (×2): 5 mg via ORAL
  Filled 2024-08-19 (×2): qty 1

## 2024-08-19 MED ORDER — DEXAMETHASONE SODIUM PHOSPHATE 10 MG/ML IJ SOLN
INTRAMUSCULAR | Status: DC | PRN
  Administered 2024-08-19: 10 mg via INTRAVENOUS

## 2024-08-19 MED ORDER — HYDROMORPHONE HCL 1 MG/ML IJ SOLN
INTRAMUSCULAR | Status: DC | PRN
  Administered 2024-08-19 (×2): .5 mg via INTRAVENOUS

## 2024-08-19 MED ORDER — PROPOFOL 10 MG/ML IV BOLUS
INTRAVENOUS | Status: DC | PRN
  Administered 2024-08-19: 150 mg via INTRAVENOUS

## 2024-08-19 MED ORDER — LIDOCAINE HCL (CARDIAC) PF 100 MG/5ML IV SOSY
PREFILLED_SYRINGE | INTRAVENOUS | Status: DC | PRN
  Administered 2024-08-19: 100 mg via INTRAVENOUS

## 2024-08-19 MED ORDER — DEXMEDETOMIDINE HCL IN NACL 80 MCG/20ML IV SOLN
INTRAVENOUS | Status: DC | PRN
Start: 2024-08-19 — End: 2024-08-19
  Administered 2024-08-19 (×2): 8 ug via INTRAVENOUS

## 2024-08-19 MED ORDER — INDOCYANINE GREEN 25 MG IV SOLR
INTRAVENOUS | Status: AC
  Filled 2024-08-19: qty 10

## 2024-08-19 MED ORDER — BUPIVACAINE-EPINEPHRINE (PF) 0.5% -1:200000 IJ SOLN
INTRAMUSCULAR | Status: DC | PRN
  Administered 2024-08-19: 30 mL via PERINEURAL

## 2024-08-19 MED ORDER — ONDANSETRON HCL 4 MG/2ML IJ SOLN
INTRAMUSCULAR | Status: DC | PRN
  Administered 2024-08-19: 4 mg via INTRAVENOUS

## 2024-08-19 MED ORDER — HYDROMORPHONE HCL 1 MG/ML IJ SOLN
INTRAMUSCULAR | Status: AC
  Filled 2024-08-19: qty 1

## 2024-08-19 MED ORDER — SODIUM CHLORIDE (PF) 0.9 % IJ SOLN
INTRAMUSCULAR | Status: AC
  Filled 2024-08-19: qty 50

## 2024-08-19 MED ORDER — ACETAMINOPHEN 10 MG/ML IV SOLN
INTRAVENOUS | Status: AC
  Filled 2024-08-19: qty 100

## 2024-08-19 MED ORDER — ACETAMINOPHEN 10 MG/ML IV SOLN
INTRAVENOUS | Status: DC | PRN
  Administered 2024-08-19: 1000 mg via INTRAVENOUS

## 2024-08-19 MED ORDER — INDOCYANINE GREEN 25 MG IV SOLR
1.2500 mg | Freq: Once | INTRAVENOUS | Status: AC
Start: 2024-08-19 — End: 2024-08-19
  Administered 2024-08-19: 1.25 mg via INTRAVENOUS

## 2024-08-19 MED ORDER — INSULIN ASPART 100 UNIT/ML IJ SOLN
0.0000 [IU] | Freq: Every day | INTRAMUSCULAR | Status: DC
  Administered 2024-08-19: 5 [IU] via SUBCUTANEOUS
  Administered 2024-08-20: 3 [IU] via SUBCUTANEOUS
  Filled 2024-08-19 (×2): qty 1

## 2024-08-19 SURGICAL SUPPLY — 49 items
BAG PRESSURE INF REUSE 1000 (BAG) IMPLANT
CANNULA REDUCER 12-8 DVNC XI (CANNULA) ×1 IMPLANT
CATH CHOLANG 76X19 KUMAR (CATHETERS) IMPLANT
CAUTERY HOOK MNPLR 1.6 DVNC XI (INSTRUMENTS) ×1 IMPLANT
CLIP LIGATING HEMO O LOK GREEN (MISCELLANEOUS) ×1 IMPLANT
CNTNR URN SCR LID CUP LEK RST (MISCELLANEOUS) IMPLANT
DEFOGGER SCOPE WARM SEASHARP (MISCELLANEOUS) ×1 IMPLANT
DERMABOND ADVANCED .7 DNX12 (GAUZE/BANDAGES/DRESSINGS) ×1 IMPLANT
DERMABOND ADVANCED .7 DNX6 (GAUZE/BANDAGES/DRESSINGS) IMPLANT
DRAIN CHANNEL JP 15F RND 3/16 (MISCELLANEOUS) IMPLANT
DRAPE ARM DVNC X/XI (DISPOSABLE) ×4 IMPLANT
DRAPE C-ARM XRAY 36X54 (DRAPES) IMPLANT
DRAPE COLUMN DVNC XI (DISPOSABLE) ×1 IMPLANT
DRSG TEGADERM 4X4.75 (GAUZE/BANDAGES/DRESSINGS) IMPLANT
ELECTRODE REM PT RTRN 9FT ADLT (ELECTROSURGICAL) ×1 IMPLANT
EVACUATOR SILICONE 100CC (DRAIN) IMPLANT
FORCEPS BPLR 8 MD DVNC XI (FORCEP) IMPLANT
FORCEPS BPLR R/ABLATION 8 DVNC (INSTRUMENTS) ×1 IMPLANT
FORCEPS PROGRASP DVNC XI (FORCEP) ×1 IMPLANT
GLOVE BIOGEL PI IND STRL 7.5 (GLOVE) ×2 IMPLANT
GLOVE SURG SYN 7.0 PF PI (GLOVE) ×2 IMPLANT
GOWN STRL REUS W/ TWL LRG LVL3 (GOWN DISPOSABLE) ×3 IMPLANT
GRASPER SUT TROCAR 14GX15 (MISCELLANEOUS) ×1 IMPLANT
IRRIGATOR SUCT 8 DISP DVNC XI (IRRIGATION / IRRIGATOR) IMPLANT
KIT PINK PAD W/HEAD ARM REST (MISCELLANEOUS) ×1 IMPLANT
LABEL OR SOLS (LABEL) ×1 IMPLANT
MANIFOLD NEPTUNE II (INSTRUMENTS) ×1 IMPLANT
NDL HYPO 22X1.5 SAFETY MO (MISCELLANEOUS) ×1 IMPLANT
NDL INSUFFLATION 14GA 120MM (NEEDLE) ×1 IMPLANT
NDL SAFETY ECLIPSE 18X1.5 (NEEDLE) IMPLANT
NEEDLE HYPO 22X1.5 SAFETY MO (MISCELLANEOUS) ×1 IMPLANT
NEEDLE INSUFFLATION 14GA 120MM (NEEDLE) ×1 IMPLANT
NS IRRIG 500ML POUR BTL (IV SOLUTION) ×1 IMPLANT
OBTURATOR OPTICALSTD 8 DVNC (TROCAR) ×1 IMPLANT
PACK LAP CHOLECYSTECTOMY (MISCELLANEOUS) ×1 IMPLANT
SEAL UNIV 5-12 XI (MISCELLANEOUS) ×4 IMPLANT
SET TUBE SMOKE EVAC HIGH FLOW (TUBING) ×1 IMPLANT
SOLUTION ELECTROSURG ANTI STCK (MISCELLANEOUS) ×1 IMPLANT
SPIKE FLUID TRANSFER (MISCELLANEOUS) ×2 IMPLANT
SPONGE DRAIN TRACH 4X4 STRL 2S (GAUZE/BANDAGES/DRESSINGS) IMPLANT
SPONGE T-LAP 4X18 ~~LOC~~+RFID (SPONGE) IMPLANT
STOPCOCK 3WAY MALE LL (IV SETS) IMPLANT
SUT ETHILON 3-0 (SUTURE) IMPLANT
SUT VICRYL 0 UR6 27IN ABS (SUTURE) ×1 IMPLANT
SUTURE MNCRL 4-0 27XMF (SUTURE) ×1 IMPLANT
SYR 10ML LL (SYRINGE) IMPLANT
SYR 20ML LL LF (SYRINGE) IMPLANT
SYSTEM BAG RETRIEVAL 10MM (BASKET) ×1 IMPLANT
WATER STERILE IRR 500ML POUR (IV SOLUTION) ×1 IMPLANT

## 2024-08-19 NOTE — Progress Notes (Signed)
 Progress Note    Donald Davidow Sr.  FMW:[id number] DOB: [birth year]  DOA: 08/18/2024 PCP: Jeffie Cheryl BRAVO, MD      Brief Narrative:    Medical records reviewed and are as summarized below:  Donald Victory Fonder Sr. is a 73 y.o. male with medical history significant for diabetes, HTN, HLD, obesity who presented to [facility] for abnormal MRI which was done on outpatient basis for abdominal pain.  Patient stated that he had abdominal pain around Labor Day weekend in the right upper quadrant associated with some nausea and vomiting.  He stated that his pain was 7/10 in intensity around right upper quadrant, then he was evaluated with ultrasound which was not conclusive.  He was advised to do the MRI of the abdomen.  He was called with MRI report and was told to present to the ED because of abnormal MRI which showed gangrenous cholecystitis.    Assessment/Plan:   Principal Problem:   Acute cholecystitis Active Problems:   Hyperlipidemia   HTN (hypertension)   Controlled type 2 diabetes mellitus without complication, without long-term current use of insulin  (HCC)    Body mass index is 31.42 kg/m.  (Class I obesity)   Acute gangrenous cholecystitis: S/p robotic assisted lap cholecystectomy on 07/19/2024.  Continue IV Zosyn .  Analgesics as needed for pain.  Follow-up with surgeon for further recommendations.   Type II DM with hyperglycemia: NovoLog  as needed for hyperglycemia. Pioglitazone and glyburide on hold   Comorbidities include hypertension, hyperlipidemia  Diet Order             Diet clear liquid Fluid consistency: Thin  Diet effective now                                  Consultants: General Surgeon  Procedures: Robotic assisted laparoscopic cholecystectomy on 08/19/2024    Medications:    enoxaparin  (LOVENOX ) injection  0.5 mg/kg Subcutaneous Q24H   insulin  aspart  0-15 Units Subcutaneous TID WC   pantoprazole  (PROTONIX ) IV  40 mg  Intravenous Q12H   Continuous Infusions:  piperacillin -tazobactam (ZOSYN )  IV 3.375 g (08/19/24 0801)     Anti-infectives (From admission, onward)    Start     Dose/Rate Route Frequency Ordered Stop   08/18/24 1600  piperacillin -tazobactam (ZOSYN ) IVPB 3.375 g        3.375 g 12.5 mL/hr over 240 Minutes Intravenous Every 8 hours 08/18/24 1533     08/18/24 1445  piperacillin -tazobactam (ZOSYN ) IVPB 3.375 g  Status:  Discontinued        3.375 g 100 mL/hr over 30 Minutes Intravenous  Once 08/18/24 1431 08/18/24 1533              Family Communication/Anticipated D/C date and plan/Code Status   DVT prophylaxis: SCDs Start: 08/18/24 1518     Code Status: Full Code  Family Communication: Plan discussed with his wife at the bedside Disposition Plan: Plan to discharge home   Status is: Inpatient Remains inpatient appropriate because: Acute cholecystitis       Subjective:   Interval events noted.  Abdominal soft.  No other complaints.  Wife at the bedside.  Objective:    Vitals:   08/19/24 1230 08/19/24 1245 08/19/24 1251 08/19/24 1315  BP: (!) 148/66 (!) 140/76  (!) 140/67  Pulse: 81 78 80 81  Resp: 19 14 17 16   Temp:   (!) 97.5 F (36.4 C)  97.6 F (36.4 C)  TempSrc:    Oral  SpO2: (!) 88% 93% 93% 92%  Weight:      Height:       No data found.   Intake/Output Summary (Last 24 hours) at 08/19/2024 1517 Last data filed at 08/19/2024 1245 Gross per 24 hour  Intake 1340 ml  Output 655 ml  Net 685 ml   Filed Weights   08/18/24 1230  Weight: 99.3 kg    Exam:  GEN: NAD SKIN: Warm and dry EYES: No pallor or icterus ENT: MMM CV: RRR PULM: CTA B ABD: soft, ND, NT, +BS, clean and dry multiple surgical incisions, + JP drain with bloody fluid CNS: AAO x 3, non focal EXT: No edema or tenderness        Data Reviewed:   I have personally reviewed following labs and imaging studies:  Labs: Labs show the following:   Basic Metabolic  Panel: Recent Labs  Lab 08/18/24 1232 08/19/24 0535  NA 139 139  K 4.1 3.7  CL 100 103  CO2 26 26  GLUCOSE 250* 204*  BUN 14 15  CREATININE 0.86 0.93  CALCIUM 8.3* 7.9*   GFR Estimated Creatinine Clearance: 82.3 mL/min (by C-G formula based on SCr of 0.93 mg/dL). Liver Function Tests: Recent Labs  Lab 08/18/24 1232 08/19/24 0535  AST 23 20  ALT 56* 40  ALKPHOS 94 81  BILITOT 1.6* 2.1*  PROT 6.4* 5.9*  ALBUMIN 3.2* 2.8*   Recent Labs  Lab 08/18/24 1232  LIPASE 46   No results for input(s): AMMONIA in the last 168 hours. Coagulation profile Recent Labs  Lab 08/19/24 0535  INR 1.0    CBC: Recent Labs  Lab 08/18/24 1232 08/19/24 0535  WBC 9.8 8.4  HGB 15.0 14.5  HCT 43.8 42.0  MCV 85.7 84.5  PLT 385 356   Cardiac Enzymes: No results for input(s): CKTOTAL, CKMB, CKMBINDEX, TROPONINI in the last 168 hours. BNP (last 3 results) No results for input(s): PROBNP in the last 8760 hours. CBG: Recent Labs  Lab 08/18/24 1638 08/18/24 2111 08/19/24 0750 08/19/24 1152  GLUCAP 166* 216* 204* 256*   D-Dimer: No results for input(s): DDIMER in the last 72 hours. Hgb A1c: Recent Labs    08/18/24 1232  HGBA1C 7.0*   Lipid Profile: No results for input(s): CHOL, HDL, LDLCALC, TRIG, CHOLHDL, LDLDIRECT in the last 72 hours. Thyroid function studies: No results for input(s): TSH, T4TOTAL, T3FREE, THYROIDAB in the last 72 hours.  Invalid input(s): FREET3 Anemia work up: No results for input(s): VITAMINB12, FOLATE, FERRITIN, TIBC, IRON, RETICCTPCT in the last 72 hours. Sepsis Labs: Recent Labs  Lab 08/18/24 1232 08/19/24 0535  WBC 9.8 8.4    Microbiology No results found for this or any previous visit (from the past 240 hours).  Procedures and diagnostic studies:  DG Cholangiogram Operative Result Date: 08/19/2024 EXAM: INTRAOPERATIVE CHOLANGIOGRAM TECHNIQUE: Fluoroscopy provided by the radiology  department. Radiologist was not present during procedure. Image(s) submitted for review. FLUOROSCOPY DOSE AND TYPE: Radiation Dose Index: Reference Air Kerma (in mGy) = 28.65 mGy. 34 sec FT. COMPARISON: None available. CLINICAL HISTORY: [id number] Surgery, elective [id number]. Cholangiogram. FINDINGS: Status post cholecystectomy without leak. No filling defect to suggest choledocholithiasis. Normal contrast emptying into the small bowel. IMPRESSION: 1. Unremarkable intraoperative cholangiogram status post cholecystectomy. Electronically signed by: Lonni Necessary MD 08/19/2024 03:13 PM EDT RP Workstation: [id number]               LOS: 1  day   Traves Majchrzak  [facility]   Pager on [email or web address]. If 7PM-7AM, please contact night-coverage at [email or web address]     08/19/2024, 3:17 PM

## 2024-08-19 NOTE — Consult Note (Signed)
 Patient ID: Donald Victory Fonder Sr., male   DOB: Aug 21, 1951, 73 y.o.   MRN: [id number] CC: Acute Cholecystitis History of Present Illness Donald Schreck Sr. is a 73 y.o. male with PMH of DM who presents in consultation for cholecystitis. The patient reports that over labor day weekend he developed nausea and vomiting and abdominal fullnes. This progressed to right upper quadrant abdominal pain. At that time he had an US  done that did not show evidence of cholecystitis. The pain persisted but improved.  He reports that he was not really able to eat much over the intervening period.  He also reports that he had some blood in his stool.  Given that the pain persisted he underwent an MRI this past Friday.  The radiology team called him and notified that there was concern for cholecystitis.  He was also found on the ultrasound and the MRI to have a right renal mass concerning for renal cell carcinoma.   Past Medical History Past Medical History:  Diagnosis Date   Abnormal bilirubin test    ELEVATED BILIRIBIN 1.7 12/11/198   Diabetes mellitus without complication (HCC)    HOH (hard of hearing)    uses hearing aides       Past Surgical History:  Procedure Laterality Date   ctr Bilateral 1997   KNEE ARTHROSCOPY Left 1999   SHOULDER ARTHROSCOPY Left 2009   X 2    Allergies  Allergen Reactions   Pioglitazone Swelling    Lower extremity swelling - improved with stopping medication   Metformin Rash    Adverse reaction to bowels    Current Facility-Administered Medications  Medication Dose Route Frequency Provider Last Rate Last Admin   acetaminophen  (TYLENOL ) tablet 650 mg  650 mg Oral Q6H PRN Paudel, Nena, MD       Or   acetaminophen  (TYLENOL ) suppository 650 mg  650 mg Rectal Q6H PRN Paudel, Nena, MD       enoxaparin  (LOVENOX ) injection 50 mg  0.5 mg/kg Subcutaneous Q24H Paudel, Keshab, MD   50 mg at 08/18/24 2124   furosemide  (LASIX ) tablet 20 mg  20 mg Oral Daily PRN Paudel,  Keshab, MD       indocyanine green  (IC-GREEN ) injection 1.25 mg  1.25 mg Intravenous Once Marinda Jayson KIDD, MD       insulin  aspart (novoLOG ) injection 0-5 Units  0-5 Units Subcutaneous QHS Paudel, Keshab, MD   2 Units at 08/18/24 2123   insulin  aspart (novoLOG ) injection 0-9 Units  0-9 Units Subcutaneous TID WC Paudel, Keshab, MD   3 Units at 08/19/24 0801   morphine  (PF) 2 MG/ML injection 2 mg  2 mg Intravenous Q4H PRN Paudel, Keshab, MD       ondansetron  (ZOFRAN ) tablet 4 mg  4 mg Oral Q6H PRN Paudel, Keshab, MD       Or   ondansetron  (ZOFRAN ) injection 4 mg  4 mg Intravenous Q6H PRN Paudel, Keshab, MD       pantoprazole  (PROTONIX ) injection 40 mg  40 mg Intravenous Q12H Paudel, Nena, MD   40 mg at 08/19/24 0313   piperacillin -tazobactam (ZOSYN ) IVPB 3.375 g  3.375 g Intravenous Q8H Lenon Elsie HERO, RPH 12.5 mL/hr at 08/19/24 0801 3.375 g at 08/19/24 0801   polyethylene glycol (MIRALAX  / GLYCOLAX ) packet 17 g  17 g Oral Daily PRN Roann Nena, MD        Family History History reviewed. No pertinent family history.     Social History Social History  Tobacco Use   Smoking status: Former    Current packs/day: 0.00    Types: Cigarettes    Quit date: 02/08/1992    Years since quitting: 32.5   Smokeless tobacco: Never  Vaping Use   Vaping status: Never Used  Substance Use Topics   Alcohol use: No    Comment: rarely 1 beer   Drug use: Never        ROS Full ROS of systems performed and is otherwise negative there than what is stated in the HPI  Physical Exam Blood pressure (!) 153/76, pulse 67, temperature 97.8 F (36.6 C), temperature source Oral, resp. rate 18, height 5' 10 (1.778 m), weight 99.3 kg, SpO2 93%.  Alert and oriented x 3, normal work of breathing room air, regular rate and rhythm, abdomen is soft, protuberant, tender in the right upper quadrant without any rebound tenderness or guarding, negative Murphy sign, no surgical scars, moving all extremities  spontaneously Data Reviewed Independently reviewed his labs.  Is a significant for a normal white count.  He does have an increased bilirubin to 2.1 which is up from 1.6 from yesterday.  He has a normal creatinine.  I reviewed his ultrasound.  The ultrasound showed a gallbladder with gallstones without any evidence of acute cholecystitis.  I am unable to review personally the images of the MRI but I did see the read which is concerning for acute cholecystitis.  I have personally reviewed the patient's imaging and medical records.    Assessment/Plan    73 year old male with history of diabetes with several week history of smoldering right upper quadrant pain associate with nausea and vomiting.  MRI done this week concerning for acute cholecystitis.  I discussed with him that I am concerned that he has acute on chronic cholecystitis and recommended surgical intervention for this.  I discussed the risk, benefits alternatives of robotic assisted cholecystectomy including the risk of infection, bleeding need for open procedure, subtotal cholecystectomy, bile leak, injury to common bile duct as well as retained stone.  I also discussed with him that given his T bilirubin is increasing that I would like to perform an intraoperative cholangiogram.  He understands his risk and wishes to proceed with surgery     Jayson MALVA Endow 08/19/2024, 8:20 AM

## 2024-08-19 NOTE — Anesthesia Procedure Notes (Signed)
 Procedure Name: Intubation Date/Time: 08/19/2024 9:07 AM  Performed by: Landy Francena BIRCH, CRNAPre-anesthesia Checklist: Patient identified, Emergency Drugs available, Suction available and Patient being monitored Patient Re-evaluated:Patient Re-evaluated prior to induction Oxygen Delivery Method: Circle system utilized Preoxygenation: Pre-oxygenation with 100% oxygen Induction Type: IV induction Ventilation: Mask ventilation without difficulty and Oral airway inserted - appropriate to patient size Laryngoscope Size: Cleotilde and 2 Grade View: Grade I Tube type: Oral Tube size: 7.5 mm Number of attempts: 1 Airway Equipment and Method: Stylet, Oral airway and Bite block Placement Confirmation: ETT inserted through vocal cords under direct vision, positive ETCO2 and breath sounds checked- equal and bilateral Secured at: 22 cm Tube secured with: Tape Dental Injury: Teeth and Oropharynx as per pre-operative assessment

## 2024-08-19 NOTE — Op Note (Addendum)
 Robotic assisted laparoscopic Cholecystectomy  Pre-operative Diagnosis: Acute Cholecystitis  Post-operative Diagnosis: Acute Gangrenous cholecystitis  Procedure:  Robotic assisted laparoscopic Cholecystectomy  Surgeon: Jayson Endow, MD  Anesthesia: Gen. with endotracheal tube  Findings: Acutely inflamed gallbladder with perforation of the fundus and expulsion of about 20 cc of pus, multiple stones spilled in the abdomen that were retrieved, no true critical view of safety obtained but IOC performed and was able to identify the cystic duct that was clipped twice.  Estimated Blood Loss: 15cc       Specimens: Gallbladder           Complications: none   Procedure Details  The patient was seen again in the Holding Room. The benefits, complications, treatment options, and expected outcomes were discussed with the patient. The risks of bleeding, infection, recurrence of symptoms, failure to resolve symptoms, bile duct damage, bile duct leak, retained common bile duct stone, bowel injury, any of which could require further surgery and/or ERCP, stent, or papillotomy were reviewed with the patient. The likelihood of improving the patient's symptoms with return to their baseline status is good.  The patient and/or family concurred with the proposed plan, giving informed consent.  The patient was taken to Operating Room, identified  and the procedure verified as robotic Cholecystectomy.  A Time Out was held and the above information confirmed.  Prior to the induction of general anesthesia, antibiotic prophylaxis was administered. VTE prophylaxis was in place. General endotracheal anesthesia was then administered and tolerated well. After the induction, the abdomen was prepped with Chloraprep and draped in the sterile fashion. The patient was positioned in the supine position.  A veress needle was inserted into the abdomen using standard drop technique. An 8mm infra-umbilical robotic port was then  placed under direct visualization. There was no injury noted at the site of veress needle insertion. Two right sided abdominal 8mm ports followed by an 8mm left abdominal robotic ports were placed under direct visualization. The left sided abdominal port was then upsized to a 12mm robotic port.  The patient was positioned  in reverse Trendelenburg, robot was brought to the surgical field and docked in the standard fashion.  We made sure all the instrumentation was kept indirect view at all times and that there were no collision between the arms. I scrubbed out and went to the console.  The omentum was densely adhesed to the gallbladder.  This was swept off the gallbladder bluntly revealing a necrotic appearing gallbladder.  The fundus was then able to be grasped and lifted over the dome of the liver.  During retraction of the fundus there was a perforation within the gallbladder with expulsion of about 20 cc of pus. Perforation was unavoidable due to gangrenous tissue. The omentum and fibrofatty tissue were bluntly dissected off of the gallbladder down to the level of the infundibulum.  The overlying tissue close triangle was quite dense and fibrous secondary to chronic inflammation.  I was able to start laterally and dissect the peritoneum over the lateral wall of the gallbladder.  I was able to take this about halfway up the gallbladder but there was a perforation of the inferior part of the gallbladder with spillage of some stones.  Then my intervention my attention laterally.  I was unable to identify any arterial structures but with blunt dissection was able to create a plane between the gallbladder and the cystic duct candidate.  Posterior to this after clearing off all the fibrofatty tissue there was a liver  plate behind this.  However given that there was no true critical view of safety and that he had a hyperbilirubinemia this morning I elected to perform a intraoperative cholangiogram.  A Kumar clamp  was then placed into the abdomen and clamped across the cystic duct candidate.  An Angiocath was inserted and the cystic duct candidate was punctured.  We did aspirate some bile.  It flushed easily.  A cholangiogram was then shot and this is noted that our Kumar clamp was across the cystic duct as I saw both to the left and right hepatic ducts.  We did not see the duodenum in this run so another 1 was completed showing that there was brisk contrast flow into the duodenum without evidence of stones in the distal common bile duct.  The Kumar clamp was then removed and the robot was redocked.  The cystic duct was then clipped twice proximally.  The anterior gallbladder wall and part of the posterior wall were then removed from the hepatic plate with Bovie cautery.  Due to the inflammatory nature there was some of the posterior wall of the gallbladder that was left behind.  Once the gallbladder was excised from the hepatic plate the posterior wall that remained was cauterized.  The gallbladder was then placed in the Endo Catch bag.  Several stones were suctioned up and some small stones were placed within the Endo Catch bag.  The right upper quadrant was then copiously irrigated with warm saline solution and hemostasis was ensured.  There was no evidence of bile leak at the termination of the procedure.  I did elect to leave a drain in the right upper quadrant.  A 19 [place] Blake drain was then inserted through our right most trocar and positioned in the gallbladder fossa.  The robot was then undocked.  The fascia of the 12 mm left lower quadrant incision was then closed with 0 Vicryl in a suture needle passer.  The abdomen was then allowed to be desufflated under direct visualization and our drain was in good position.  The trocars were removed and the drain was secured with a 3-0 nylon.  The other incisions were closed with 4-0 Monocryl and dressed with surgical glue.  Prior to termination of procedure all sponge and  instrument counts were correct x 2.  The patient was then awoken from general endotracheal anesthesia and transferred the PACU in good condition.               Jayson Endow, M.D. [facility]

## 2024-08-19 NOTE — Anesthesia Postprocedure Evaluation (Signed)
 Anesthesia Post Note  Patient: Donald Willis [facility] Sr.  Procedure(s) Performed: CHOLECYSTECTOMY, ROBOT-ASSISTED, LAPAROSCOPIC (Abdomen) CHOLANGIOGRAM, INTRAOPERATIVE (Abdomen)  Patient location during evaluation: PACU Anesthesia Type: General Level of consciousness: awake and alert Pain management: pain level controlled Vital Signs Assessment: post-procedure vital signs reviewed and stable Respiratory status: spontaneous breathing, nonlabored ventilation, respiratory function stable and patient connected to nasal cannula oxygen Cardiovascular status: blood pressure returned to baseline and stable Postop Assessment: no apparent nausea or vomiting Anesthetic complications: no   No notable events documented.   Last Vitals:  Vitals:   08/19/24 1315 08/19/24 1500  BP: (!) 140/67   Pulse: 81   Resp: 16   Temp: 36.4 C   SpO2: 92% 92%    Last Pain:  Vitals:   08/19/24 1819  TempSrc:   PainSc: 5                  Debby Mines

## 2024-08-19 NOTE — Anesthesia Preprocedure Evaluation (Signed)
 Anesthesia Evaluation  Patient identified by MRN, date of birth, ID band Patient awake    Reviewed: Allergy & Precautions, H&P , NPO status , Patient's Chart, lab work & pertinent test results, reviewed documented beta blocker date and time   Airway Mallampati: II  TM Distance: >3 FB Neck ROM: full    Dental  (+) Teeth Intact, Dental Advidsory Given, Missing   Pulmonary neg pulmonary ROS, former smoker   Pulmonary exam normal        Cardiovascular Exercise Tolerance: Good negative cardio ROS Normal cardiovascular exam Rhythm:regular Rate:Normal     Neuro/Psych negative neurological ROS  negative psych ROS   GI/Hepatic negative GI ROS, Neg liver ROS,,,  Endo/Other  negative endocrine ROSdiabetes, Well Controlled, Type 2, Oral Hypoglycemic Agents    Renal/GU      Musculoskeletal   Abdominal   Peds  Hematology negative hematology ROS (+)   Anesthesia Other Findings Past Medical History: No date: Abnormal bilirubin test     Comment:  ELEVATED BILIRIBIN 1.7 12/11/198 No date: Diabetes mellitus without complication (HCC) No date: HOH (hard of hearing)     Comment:  uses hearing aides Past Surgical History: 1997: ctr; Bilateral 1999: KNEE ARTHROSCOPY; Left 2009: SHOULDER ARTHROSCOPY; Left     Comment:  X 2 BMI    Body Mass Index:  31.79 kg/m     Reproductive/Obstetrics negative OB ROS                              Anesthesia Physical Anesthesia Plan  ASA: 2  Anesthesia Plan: General ETT   Post-op Pain Management:    Induction: Intravenous  PONV Risk Score and Plan: 3 and Ondansetron  and Dexamethasone   Airway Management Planned: Oral ETT  Additional Equipment:   Intra-op Plan:   Post-operative Plan: Extubation in OR  Informed Consent: I have reviewed the patients History and Physical, chart, labs and discussed the procedure including the risks, benefits and alternatives  for the proposed anesthesia with the patient or authorized representative who has indicated his/her understanding and acceptance.     Dental Advisory Given  Plan Discussed with: Anesthesiologist, CRNA and Surgeon  Anesthesia Plan Comments: (Patient consented for risks of anesthesia including but not limited to:  - adverse reactions to medications - damage to eyes, teeth, lips or other oral mucosa - nerve damage due to positioning  - sore throat or hoarseness - Damage to heart, brain, nerves, lungs, other parts of body or loss of life  Patient voiced understanding and assent.)        Anesthesia Quick Evaluation

## 2024-08-19 NOTE — Transfer of Care (Signed)
 Immediate Anesthesia Transfer of Care Note  Patient: Donald Victory Fonder Sr.  Procedure(s) Performed: CHOLECYSTECTOMY, ROBOT-ASSISTED, LAPAROSCOPIC (Abdomen) CHOLANGIOGRAM, INTRAOPERATIVE (Abdomen)  Patient Location: PACU  Anesthesia Type:General  Level of Consciousness: drowsy  Airway & Oxygen Therapy: Patient Spontanous Breathing and Patient connected to face mask oxygen  Post-op Assessment: Report given to RN, Post -op Vital signs reviewed and stable, and Patient moving all extremities  Post vital signs: Reviewed and stable  Last Vitals:  Vitals Value Taken Time  BP 152/72 08/19/24 11:46  Temp 36.5 C 08/19/24 11:47  Pulse 81 08/19/24 11:53  Resp 16 08/19/24 11:53  SpO2 97 % 08/19/24 11:53  Vitals shown include unfiled device data.  Last Pain:  Vitals:   08/19/24 1147  TempSrc:   PainSc: Asleep         Complications: No notable events documented.

## 2024-08-20 ENCOUNTER — Encounter: Payer: Self-pay | Admitting: General Surgery

## 2024-08-20 DIAGNOSIS — K810 Acute cholecystitis: Secondary | ICD-10-CM | POA: Diagnosis not present

## 2024-08-20 LAB — GLUCOSE, CAPILLARY
Glucose-Capillary: 266 mg/dL — ABNORMAL HIGH (ref 70–99)
Glucose-Capillary: 263 mg/dL — ABNORMAL HIGH (ref 70–99)
Glucose-Capillary: 208 mg/dL — ABNORMAL HIGH (ref 70–99)
Glucose-Capillary: 275 mg/dL — ABNORMAL HIGH (ref 70–99)

## 2024-08-20 MED ORDER — INSULIN GLARGINE 100 UNITS/ML SOLOSTAR PEN
10.0000 [IU] | PEN_INJECTOR | Freq: Every day | SUBCUTANEOUS | Status: DC
  Filled 2024-08-20: qty 3

## 2024-08-20 MED ORDER — LIVING WELL WITH DIABETES BOOK
Freq: Once | Status: AC
  Filled 2024-08-20: qty 1

## 2024-08-20 MED ORDER — INSULIN GLARGINE 100 UNIT/ML ~~LOC~~ SOLN
10.0000 [IU] | Freq: Every day | SUBCUTANEOUS | Status: DC
  Administered 2024-08-20 – 2024-08-21 (×2): 10 [IU] via SUBCUTANEOUS
  Filled 2024-08-20 (×2): qty 0.1

## 2024-08-20 NOTE — Progress Notes (Signed)
 Progress Note    Donald Savitz Sr.  FMW:[id number] DOB: 1951/05/12  DOA: 08/18/2024 PCP: Donald Cheryl BRAVO, MD      Brief Narrative:    Medical records reviewed and are as summarized below:  Donald Victory Fonder Sr. is a 73 y.o. male with medical history significant for diabetes, HTN, HLD, obesity who presented to [facility] for abnormal MRI which was done on outpatient basis for abdominal pain.  Patient stated that he had abdominal pain around Labor Day weekend in the right upper quadrant associated with some nausea and vomiting.  He stated that his pain was 7/10 in intensity around right upper quadrant, then he was evaluated with ultrasound which was not conclusive.  He was advised to do the MRI of the abdomen.  He was called with MRI report and was told to present to the ED because of abnormal MRI which showed gangrenous cholecystitis.    Assessment/Plan:   Principal Problem:   Acute cholecystitis Active Problems:   Hyperlipidemia   HTN (hypertension)   Controlled type 2 diabetes mellitus without complication, without long-term current use of insulin  (HCC)    Body mass index is 31.42 kg/m.  (Class I obesity)   Acute gangrenous cholecystitis: S/p robotic assisted lap cholecystectomy on 07/19/2024.  G6 as needed.  Continue IV Zosyn .  Follow-up with general surgeon. Possible discharge to home tomorrow.   Type II DM with hyperglycemia: Start Lantus  10 units daily.  NovoLog  as needed for hyperglycemia. Trulicity, pioglitazone and glyburide on hold   Comorbidities include hypertension, hyperlipidemia   Diet Order             Diet Carb Modified Fluid consistency: Thin; Room service appropriate? Yes  Diet effective now                                  Consultants: General Surgeon  Procedures: Robotic assisted laparoscopic cholecystectomy on 08/19/2024    Medications:    enoxaparin  (LOVENOX ) injection  0.5 mg/kg Subcutaneous Q24H   insulin   aspart  0-15 Units Subcutaneous TID WC   insulin  aspart  0-5 Units Subcutaneous QHS   living well with diabetes book   Does not apply Once   pantoprazole  (PROTONIX ) IV  40 mg Intravenous Q12H   Continuous Infusions:  piperacillin -tazobactam (ZOSYN )  IV 3.375 g (08/20/24 0847)     Anti-infectives (From admission, onward)    Start     Dose/Rate Route Frequency Ordered Stop   08/18/24 1600  piperacillin -tazobactam (ZOSYN ) IVPB 3.375 g        3.375 g 12.5 mL/hr over 240 Minutes Intravenous Every 8 hours 08/18/24 1533     08/18/24 1445  piperacillin -tazobactam (ZOSYN ) IVPB 3.375 g  Status:  Discontinued        3.375 g 100 mL/hr over 30 Minutes Intravenous  Once 08/18/24 1431 08/18/24 1533              Family Communication/Anticipated D/C date and plan/Code Status   DVT prophylaxis: SCDs Start: 08/18/24 1518     Code Status: Full Code  Family Communication: Plan discussed with his wife at the bedside Disposition Plan: Plan to discharge home   Status is: Inpatient Remains inpatient appropriate because: Acute cholecystitis       Subjective:   Interval events noted.  He still having some abdominal pain.  Wife at the bedside.  Objective:    Vitals:   08/19/24 1500 08/19/24  1931 08/20/24 0404 08/20/24 0808  BP:  (!) 145/60 (!) 142/62 (!) 141/70  Pulse:  84 77 86  Resp:  18 18 19   Temp:  98.4 F (36.9 C) 99 F (37.2 C) 98.3 F (36.8 C)  TempSrc:   Oral   SpO2: 92% 96% 91% 94%  Weight:      Height:       No data found.   Intake/Output Summary (Last 24 hours) at 08/20/2024 1414 Last data filed at 08/20/2024 1300 Gross per 24 hour  Intake 792.92 ml  Output 865 ml  Net -72.08 ml   Filed Weights   08/18/24 1230  Weight: 99.3 kg    Exam:  GEN: NAD SKIN: Warm and dry EYES: No pallor or icterus ENT: MMM CV: RRR PULM: CTA B ABD: soft, ND, NT, +BS, surgical incisions are clean, dry and intact, + JP drain with small bloody fluid CNS: AAO x 3, non  focal EXT: No edema or tenderness       Data Reviewed:   I have personally reviewed following labs and imaging studies:  Labs: Labs show the following:   Basic Metabolic Panel: Recent Labs  Lab 08/18/24 1232 08/19/24 0535  NA 139 139  K 4.1 3.7  CL 100 103  CO2 26 26  GLUCOSE 250* 204*  BUN 14 15  CREATININE 0.86 0.93  CALCIUM 8.3* 7.9*   GFR Estimated Creatinine Clearance: 82.3 mL/min (by C-G formula based on SCr of 0.93 mg/dL). Liver Function Tests: Recent Labs  Lab 08/18/24 1232 08/19/24 0535  AST 23 20  ALT 56* 40  ALKPHOS 94 81  BILITOT 1.6* 2.1*  PROT 6.4* 5.9*  ALBUMIN 3.2* 2.8*   Recent Labs  Lab 08/18/24 1232  LIPASE 46   No results for input(s): AMMONIA in the last 168 hours. Coagulation profile Recent Labs  Lab 08/19/24 0535  INR 1.0    CBC: Recent Labs  Lab 08/18/24 1232 08/19/24 0535  WBC 9.8 8.4  HGB 15.0 14.5  HCT 43.8 42.0  MCV 85.7 84.5  PLT 385 356   Cardiac Enzymes: No results for input(s): CKTOTAL, CKMB, CKMBINDEX, TROPONINI in the last 168 hours. BNP (last 3 results) No results for input(s): PROBNP in the last 8760 hours. CBG: Recent Labs  Lab 08/19/24 1152 08/19/24 1618 08/19/24 2104 08/20/24 0812 08/20/24 1154  GLUCAP 256* 318* 374* 266* 263*   D-Dimer: No results for input(s): DDIMER in the last 72 hours. Hgb A1c: Recent Labs    08/18/24 1232  HGBA1C 7.0*   Lipid Profile: No results for input(s): CHOL, HDL, LDLCALC, TRIG, CHOLHDL, LDLDIRECT in the last 72 hours. Thyroid function studies: No results for input(s): TSH, T4TOTAL, T3FREE, THYROIDAB in the last 72 hours.  Invalid input(s): FREET3 Anemia work up: No results for input(s): VITAMINB12, FOLATE, FERRITIN, TIBC, IRON, RETICCTPCT in the last 72 hours. Sepsis Labs: Recent Labs  Lab 08/18/24 1232 08/19/24 0535  WBC 9.8 8.4    Microbiology No results found for this or any previous visit (from  the past 240 hours).  Procedures and diagnostic studies:  DG Cholangiogram Operative Result Date: 08/19/2024 EXAM: INTRAOPERATIVE CHOLANGIOGRAM TECHNIQUE: Fluoroscopy provided by the radiology department. Radiologist was not present during procedure. Image(s) submitted for review. FLUOROSCOPY DOSE AND TYPE: Radiation Dose Index: Reference Air Kerma (in mGy) = 28.65 mGy. 34 sec FT. COMPARISON: None available. CLINICAL HISTORY: [id number] Surgery, elective [id number]. Cholangiogram. FINDINGS: Status post cholecystectomy without leak. No filling defect to suggest choledocholithiasis. Normal contrast emptying into  the small bowel. IMPRESSION: 1. Unremarkable intraoperative cholangiogram status post cholecystectomy. Electronically signed by: Lonni Necessary MD 08/19/2024 03:13 PM EDT RP Workstation: [id number]               LOS: 2 days   Haniel Fix  [facility]   Pager on [email or web address]. If 7PM-7AM, please contact night-coverage at [email or web address]     08/20/2024, 2:14 PM

## 2024-08-20 NOTE — Inpatient Diabetes Management (Addendum)
 Inpatient Diabetes Program Recommendations  AACE/ADA: New Consensus Statement on Inpatient Glycemic Control (2015)  Target Ranges:  Prepandial:   less than 140 mg/dL      Peak postprandial:   less than 180 mg/dL (1-2 hours)      Critically ill patients:  140 - 180 mg/dL   Lab Results  Component Value Date   GLUCAP 266 (H) 08/20/2024   HGBA1C 7.0 (H) 08/18/2024    Latest Reference Range & Units 08/18/24 16:38 08/18/24 21:11 08/19/24 07:50 08/19/24 11:52 08/19/24 16:18 08/19/24 21:04 08/20/24 08:12  Glucose-Capillary 70 - 99 mg/dL 833 (H) 783 (H) 795 (H) 256 (H) 318 (H) 374 (H) 266 (H)  (H): Data is abnormally high  Diabetes history: DM2 Outpatient Diabetes medications:  Glyburide 5 mg bid Trulicity 0.75 mg weekly Current orders for Inpatient glycemic control: Novolog  0-15 units tid, 0-5 units hs  Decadron  10 mg 0915 am 08/19/24  Inpatient Diabetes Program Recommendations:   Spoke with patient's wife regarding diabetes management. Wife brought from home a CGM that patient needs assistance with placing and I will assist with placement. Reviewed that CBGs are increased post steroid of Decadron  10 mg given yesterday. Patient also requests additional information regarding nutrition-including dietician consult and outpt. Teaching consult. Ordered Living Well With Diabetes for patient review.  Will plan to see pt. To assist with placement of CGM today. 12:00 noon Met with pt. And wife for extended time. Placed Sola Dexcom sensor on pt's left upper arm. Patient had prescription from home. Discussed in detail CGM and also answered many nutrition questions. Patient is looking for a guide for a weeks menu. Patient and wife are already doing low carbohydrate, no sugar drinks and grilling meat and veggies. Patient and wife appreciative of assistance with CGM and nutrition guidance.  Thank you, Gael Delude E. Dejion Grillo, RN, MSN, CNS, CDCES  Diabetes Coordinator Inpatient Glycemic Control Team Team Pager  [phone] (8am-5pm) 08/20/2024 11:14 AM

## 2024-08-20 NOTE — Progress Notes (Signed)
 [facility] SURGICAL PROGRESS NOTE  Hospital Day(s): 2.   Post op day(s): 1 Day Post-Op.   Interval History:  Patient seen and examined No acute events or new complaints overnight.  Patient reports he is quite sore, this seems worse in upper abdomen and shoulders - suspect from insufflation No fever, chills, nausea, emesis No new labs this morning Drain with 105 ccs out; serosanguinous  He is advanced to soft diet Continues on Zosyn   Vital signs in last 24 hours: [min-max] current  Temp:  [97.5 F (36.4 C)-99 F (37.2 C)] 98.3 F (36.8 C) (09/15 0808) Pulse Rate:  [75-86] 86 (09/15 0808) Resp:  [13-19] 19 (09/15 0808) BP: (140-166)/(60-76) 141/70 (09/15 0808) SpO2:  [88 %-98 %] 94 % (09/15 0808)     Height: 5' 10 (177.8 cm) Weight: 99.3 kg BMI (Calculated): 31.42   Intake/Output last 2 shifts:  09/14 0701 - 09/15 0700 In: 1652.9 [P.O.:480; I.V.:1000; IV Piggyback:172.9] Out: 735 [Urine:600; Drains:105; Blood:30]   Physical Exam:  Constitutional: alert, cooperative and no distress  Respiratory: breathing non-labored at rest  Cardiovascular: regular rate and sinus rhythm  Gastrointestinal: soft, incisional soreness, he is mildly distended, no rebound/guarding. Surgical drain in right abdomen; serosanguinous Integumentary: Laparoscopic incisions are CDI with dermabond, no erythema or drainage   Labs:     Latest Ref Rng & Units 08/19/2024    5:35 AM 08/18/2024   12:32 PM 11/30/2018   10:50 AM  CBC  WBC 4.0 - 10.5 K/uL 8.4  9.8  7.1   Hemoglobin 13.0 - 17.0 g/dL 85.4  84.9  85.3   Hematocrit 39.0 - 52.0 % 42.0  43.8  41.6   Platelets 150 - 400 K/uL 356  385  227       Latest Ref Rng & Units 08/19/2024    5:35 AM 08/18/2024   12:32 PM  CMP  Glucose 70 - 99 mg/dL 795  749   BUN 8 - 23 mg/dL 15  14   Creatinine 9.38 - 1.24 mg/dL 9.06  9.13   Sodium 864 - 145 mmol/L 139  139   Potassium 3.5 - 5.1 mmol/L 3.7  4.1   Chloride 98 - 111 mmol/L 103  100    CO2 22 - 32 mmol/L 26  26   Calcium 8.9 - 10.3 mg/dL 7.9  8.3   Total Protein 6.5 - 8.1 g/dL 5.9  6.4   Total Bilirubin 0.0 - 1.2 mg/dL 2.1  1.6   Alkaline Phos 38 - 126 U/L 81  94   AST 15 - 41 U/L 20  23   ALT 0 - 44 U/L 40  56      Imaging studies: No new pertinent imaging studies   Assessment/Plan:  73 y.o. male 1 Day Post-Op s/p robotic assisted laparoscopic cholecystectomy for gangrenous cholecystitis   - Will can trial soft diet this AM; reviewed dietary recommendations   - Continue IV Abx (Zosyn ); he certainly will need PO Abx for home - Continue surgical drain; monitor and record out - He will DC home with this   - Monitor abdominal examination; on-going bowel function   - Pain control prn; antiemetics prn   - Okay to mobilize  - Further management per primary service; we will follow    - Discharge Planning: Diet advancing, drain reassuring. He could potentially DC home this PM or tomorrow morning, no rush. He will need to go home with drain. We will see him early next week for drain  removal.   All of the above findings and recommendations were discussed with the patient, patient's family (wife), and the medical team, and all of patient's and family's questions were answered to their expressed satisfaction.  -- Arthea Platt, PA-C [facility] 08/20/2024, 8:45 AM M-F: 7am - 4pm

## 2024-08-20 NOTE — TOC CM/SW Note (Signed)
 Transition of Care [facility]) - Inpatient Brief Assessment   Patient Details  Name: Endre Coutts [facility] Sr. MRN: [id number] Date of Birth: May 25, 1951  Transition of Care [facility]) CM/SW Contact:    Corean ONEIDA Haddock, RN Phone Number: 08/20/2024, 12:11 PM   Clinical Narrative:   Transition of Care [facility]) Screening Note   Patient Details  Name: Kin Galbraith [facility] Sr. Date of Birth: [birth year]   Transition of Care [facility]) CM/SW Contact:    Corean ONEIDA Haddock, RN Phone Number: 08/20/2024, 12:11 PM    Transition of Care Department [facility]) has reviewed patient and no TOC needs have been identified at this time.  If new patient transition needs arise, please place a TOC consult.    Transition of Care Asessment: Insurance and Status: Insurance coverage has been reviewed Patient has primary care physician: Yes     Prior/Current Home Services: No current home services Social Drivers of Health Review: SDOH reviewed no interventions necessary Readmission risk has been reviewed: Yes Transition of care needs: no transition of care needs at this time

## 2024-08-20 NOTE — Progress Notes (Signed)
 Mobility Specialist - Progress Note   08/20/24 0937  Mobility  Activity Pivoted/transferred from bed to chair;Ambulated with assistance  Level of Assistance Contact guard assist, steadying assist  Assistive Device None  Distance Ambulated (ft) 6 ft  Activity Response Tolerated well  Mobility visit 1 Mobility  Mobility Specialist Start Time (ACUTE ONLY) [id number]  Mobility Specialist Stop Time (ACUTE ONLY) 0931  Mobility Specialist Time Calculation (min) (ACUTE ONLY) 8 min   Pt seated EOB upon entry, utilizing RA. Pt transferred to the recliner via SPT CGA-MinG, tolerated well. Pt left seated with needs within reach. Family member present at bedside.   America Silvan Mobility Specialist 08/20/24 9:45 AM

## 2024-08-20 NOTE — Discharge Instructions (Signed)
 In addition to included general post-operative instructions,  Diet: Resume home diet. Recommend avoiding or limiting fatty/greasy foods over the next few days/week. If you do eat these, you may (or may not) notice diarrhea. This is expected while your body adjusts to not having a gallbladder, and it typically resolves with time.    Activity: No heavy lifting >20 pounds (children, pets, laundry, garbage) or strenuous activity for 4 weeks, but light activity and walking are encouraged. Do not drive or drink alcohol if taking narcotic pain medications or having pain that might distract from driving.  Drain: Monitor and record drain output daily; hand out given. Please bring handout with you to follow up appointment.   Wound care: If you can keep drain site covered, you may shower/get incision wet with soapy water and pat dry (do not rub incisions), but no baths or submerging incision underwater until follow-up.   Medications: Resume all home medications. For mild to moderate pain: acetaminophen  (Tylenol ) or ibuprofen/naproxen (if no kidney disease). Combining Tylenol  with alcohol can substantially increase your risk of causing liver disease. Narcotic pain medications, if prescribed, can be used for severe pain, though may cause nausea, constipation, and drowsiness. Do not combine Tylenol  and Percocet (or similar) within a 6 hour period as Percocet (and similar) contain(s) Tylenol . If you do not need the narcotic pain medication, you do not need to fill the prescription.  Call office [phone] / [phone]) at any time if any questions, worsening pain, fevers/chills, bleeding, drainage from incision site, or other concerns.

## 2024-08-21 DIAGNOSIS — K810 Acute cholecystitis: Secondary | ICD-10-CM | POA: Diagnosis not present

## 2024-08-21 LAB — GLUCOSE, CAPILLARY
Glucose-Capillary: 256 mg/dL — ABNORMAL HIGH (ref 70–99)
Glucose-Capillary: 258 mg/dL — ABNORMAL HIGH (ref 70–99)

## 2024-08-21 LAB — SURGICAL PATHOLOGY

## 2024-08-21 MED ORDER — ACETAMINOPHEN 325 MG PO TABS: 650.0000 mg | ORAL_TABLET | Freq: Four times a day (QID) | ORAL | Status: AC | PRN

## 2024-08-21 NOTE — Evaluation (Signed)
 Physical Therapy Evaluation Patient Details Name: Carmine Carrozza [facility] Sr. MRN: [id number] DOB: 06/20/1951 Today's Date: 08/21/2024  History of Present Illness  Patient is a 73 year old male with acute gangrenous cholecystitis, s/p robotic assisted lap cholecystectomy. PMH: diabetes, HTN, HLD, obesity  Clinical Impression  Patient is agreeable to PT evaluation. Supportive spouse at the bedside. Patient is independent at baseline.   Today the patient reports 6/10 pain in the abdomen. No physical assistance was required with mobility today. He was able to stand from the recliner chair x 2 bouts with cues for hand placement. He walked into the hallway using his own rolling walker. He was fatigued with mobility. Encouraged routine, short distance ambulation at home using the rolling walker for conditioning. Patient was able to simulate going up/down a step. The spouse declined need for home health and reports feeling ready to bring the patient home today. PT will follow in the hospital if patient does not discharge today as anticipated.       If plan is discharge home, recommend the following: Assist for transportation;Assistance with cooking/housework;A little help with bathing/dressing/bathroom;Help with stairs or ramp for entrance   Can travel by private vehicle        Equipment Recommendations Rolling walker (2 wheels) (has already been delivered to the room)  Recommendations for Other Services       Functional Status Assessment Patient has had a recent decline in their functional status and demonstrates the ability to make significant improvements in function in a reasonable and predictable amount of time.     Precautions / Restrictions Precautions Precautions: Fall Recall of Precautions/Restrictions: Intact Precaution/Restrictions Comments: RLQ drain Restrictions Weight Bearing Restrictions Per Provider Order: No      Mobility  Bed Mobility               General bed  mobility comments: not assessed. patient has been sleeping in the chair and plans to sleep in a recliner at home.    Transfers Overall transfer level: Needs assistance Equipment used: Rolling walker (2 wheels) Transfers: Sit to/from Stand Sit to Stand: Supervision           General transfer comment: 2 standing bouts performed. initial cues for hand placement for safety. no physical assistance required for standing    Ambulation/Gait Ambulation/Gait assistance: Supervision Gait Distance (Feet): 75 Feet Assistive device: Rolling walker (2 wheels) Gait Pattern/deviations: Step-through pattern, Trunk flexed Gait velocity: decreased     General Gait Details: slow but steady with rolling walker for support. patient walked into the hallway with increasing gait speed with increased ambulation distance. patient is fatigued with activity. Sp02 100% on room air, heart rate 99-100bpm after walking  Stairs Stairs: Yes       General stair comments: patient performed standing marching and was able to clear foot enough bilaterally to simulate getting up a step at home. encourage family assistance as needed with railings at home.  Wheelchair Mobility     Tilt Bed    Modified Rankin (Stroke Patients Only)       Balance Overall balance assessment: Needs assistance Sitting-balance support: Feet supported Sitting balance-Leahy Scale: Good     Standing balance support: Bilateral upper extremity supported Standing balance-Leahy Scale: Fair                               Pertinent Vitals/Pain Pain Assessment Pain Assessment: 0-10 Pain Score: 6  Pain Location: abdominal pain  Pain Descriptors / Indicators: Discomfort Pain Intervention(s): Monitored during session, Limited activity within patient's tolerance, Repositioned    Home Living Family/patient expects to be discharged to:: Private residence Living Arrangements: Spouse/significant other;Children (son) Available  Help at Discharge: Family;Available 24 hours/day Type of Home: House Home Access: Stairs to enter Entrance Stairs-Rails: Right;Left;Can reach both Entrance Stairs-Number of Steps: 3   Home Layout: One level Home Equipment: Agricultural consultant (2 wheels);Shower seat      Prior Function Prior Level of Function : Independent/Modified Independent                     Extremity/Trunk Assessment   Upper Extremity Assessment Upper Extremity Assessment: Overall WFL for tasks assessed    Lower Extremity Assessment Lower Extremity Assessment: Generalized weakness (endurance impaired for sustianed activity)       Communication   Communication Communication: No apparent difficulties    Cognition Arousal: Alert Behavior During Therapy: WFL for tasks assessed/performed   PT - Cognitive impairments: No apparent impairments                         Following commands: Intact       Cueing Cueing Techniques: Verbal cues     General Comments General comments (skin integrity, edema, etc.): encouraged leg elevation for edema management in distal LE as patient reports mild swelling of legs compared to normal. also encouraged short distance ambulation at home for conditioning using rolling walker.    Exercises     Assessment/Plan    PT Assessment Patient needs continued PT services  PT Problem List Decreased strength;Decreased activity tolerance;Decreased mobility;Decreased balance       PT Treatment Interventions DME instruction;Gait training;Stair training;Functional mobility training;Therapeutic activities;Therapeutic exercise;Balance training;Neuromuscular re-education;Cognitive remediation    PT Goals (Current goals can be found in the Care Plan section)  Acute Rehab PT Goals Patient Stated Goal: to go home PT Goal Formulation: With patient/family Time For Goal Achievement: 09/04/24 Potential to Achieve Goals: Good    Frequency Min 2X/week      Co-evaluation               AM-PAC PT 6 Clicks Mobility  Outcome Measure Help needed turning from your back to your side while in a flat bed without using bedrails?: A Little Help needed moving from lying on your back to sitting on the side of a flat bed without using bedrails?: A Little Help needed moving to and from a bed to a chair (including a wheelchair)?: A Little Help needed standing up from a chair using your arms (e.g., wheelchair or bedside chair)?: A Little Help needed to walk in hospital room?: A Little Help needed climbing 3-5 steps with a railing? : A Little 6 Click Score: 18    End of Session   Activity Tolerance: Patient tolerated treatment well Patient left: in chair;with call bell/phone within reach;with family/visitor present Nurse Communication: Mobility status PT Visit Diagnosis: Muscle weakness (generalized) (M62.81);Unsteadiness on feet (R26.81)    Time: 8648-8585 PT Time Calculation (min) (ACUTE ONLY): 23 min   Charges:   PT Evaluation $PT Eval Moderate Complexity: 1 Mod PT Treatments $Therapeutic Activity: 8-22 mins PT General Charges $$ ACUTE PT VISIT: 1 Visit         Randine Essex, PT, MPT   Randine LULLA Essex 08/21/2024, 2:32 PM

## 2024-08-21 NOTE — Discharge Summary (Signed)
 Physician Discharge Summary   Patient: Donald Willis [facility] Sr. MRN: [id number] DOB: Nov 10, 1951  Admit date:     08/18/2024  Discharge date: 08/21/24  Discharge Physician: AIDA CHO   PCP: Jeffie Cheryl BRAVO, MD   Recommendations at discharge:   Follow-up with PCP in 1 to 2 weeks Follow-up with general surgeon in 1 week  Discharge Diagnoses: Principal Problem:   Acute cholecystitis Active Problems:   Hyperlipidemia   HTN (hypertension)   Controlled type 2 diabetes mellitus without complication, without long-term current use of insulin  (HCC)  Resolved Problems:   * No resolved hospital problems. *  Hospital Course:  Donald Luecke Sr. is a 73 y.o. male with medical history significant for diabetes, HTN, HLD, obesity who presented to [facility] for abnormal MRI which was done on outpatient basis for abdominal pain.  Patient stated that he had abdominal pain around Labor Day weekend in the right upper quadrant associated with some nausea and vomiting.  He stated that his pain was 7/10 in intensity around right upper quadrant, then he was evaluated with ultrasound which was not conclusive.  He was advised to do the MRI of the abdomen.  He was called with MRI report and was told to present to the ED because of abnormal MRI which showed gangrenous cholecystitis.     Assessment and Plan:    Acute gangrenous cholecystitis: S/p robotic assisted lap cholecystectomy on 07/19/2024.  Analgesics as needed for pain.  Okay for discharge from surgical standpoint.     Type II DM with hyperglycemia: Resume Trulicity and glyburide. It appears he no longer takes pioglitazone at home.       Comorbidities include hypertension, hyperlipidemia    Debility: PT recommended home health therapy and a rolling walker.   His condition has improved and he's deemed stable for discharge home today.      Consultants: General Surgeon Procedures performed: Laparoscopic cholecystectomy Disposition:  Home health Diet recommendation:  Discharge Diet Orders (From admission, onward)     Start     Ordered   08/21/24 0000  Diet - low sodium heart healthy        08/21/24 1423           Cardiac and Carb modified diet DISCHARGE MEDICATION: Allergies as of 08/21/2024       Reactions   Pioglitazone Swelling   Lower extremity swelling - improved with stopping medication   Metformin Rash   Adverse reaction to bowels        Medication List     STOP taking these medications    pioglitazone 45 MG tablet Commonly known as: ACTOS       TAKE these medications    acetaminophen  325 MG tablet Commonly known as: TYLENOL  Take 2 tablets (650 mg total) by mouth every 6 (six) hours as needed for mild pain (pain score 1-3) (or Fever >/= 101).   atorvastatin 20 MG tablet Commonly known as: LIPITOR Take 20 mg by mouth daily.   furosemide  20 MG tablet Commonly known as: LASIX  Take 20 mg by mouth daily as needed for fluid or edema.   glyBURIDE 5 MG tablet Commonly known as: DIABETA Take 5 mg by mouth 2 (two) times daily with a meal.   pantoprazole  40 MG tablet Commonly known as: PROTONIX  Take 40 mg by mouth daily.   Trulicity 0.75 MG/0.5ML Soaj Generic drug: Dulaglutide Inject 0.75 mg into the skin once a week.   Viagra 100 MG tablet Generic drug: sildenafil TAKE 1  TABLET AS DIRECTED               Durable Medical Equipment  (From admission, onward)           Start     Ordered   08/21/24 0949  For home use only DME Walker rolling  Once       Question Answer Comment  Walker: With 5 Inch Wheels   Patient needs a walker to treat with the following condition Physical deconditioning      08/21/24 0948   08/21/24 0000  For home use only DME Walker rolling       Question Answer Comment  Walker: With 5 Inch Wheels   Patient needs a walker to treat with the following condition Debility      08/21/24 1333              Discharge Care Instructions  (From  admission, onward)           Start     Ordered   08/21/24 0000  Discharge wound care:       Comments: Follow-up with general surgeon in 1 week to remove surgical drain   08/21/24 1423            Follow-up Information     Marinda Jayson KIDD, MD. Go on 08/28/2024.   Specialty: General Surgery Why: Go to appointment on 09/23 at 345 PM Contact information: [address] [place] [address] [phone]                Discharge Exam: Donald Willis   08/18/24 1230  Weight: 99.3 kg   GEN: NAD SKIN: Warm and dry EYES: No pallor or icterus ENT: MMM CV: RRR PULM: CTA B ABD: soft, obese, NT, +BS, + JP drain with small amount of bloody fluid. CNS: AAO x 3, non focal EXT: No edema or tenderness   Condition at discharge: good  The results of significant diagnostics from this hospitalization (including imaging, microbiology, ancillary and laboratory) are listed below for reference.   Imaging Studies: DG Cholangiogram Operative Result Date: 08/19/2024 EXAM: INTRAOPERATIVE CHOLANGIOGRAM TECHNIQUE: Fluoroscopy provided by the radiology department. Radiologist was not present during procedure. Image(s) submitted for review. FLUOROSCOPY DOSE AND TYPE: Radiation Dose Index: Reference Air Kerma (in mGy) = 28.65 mGy. 34 sec FT. COMPARISON: None available. CLINICAL HISTORY: [id number] Surgery, elective [id number]. Cholangiogram. FINDINGS: Status post cholecystectomy without leak. No filling defect to suggest choledocholithiasis. Normal contrast emptying into the small bowel. IMPRESSION: 1. Unremarkable intraoperative cholangiogram status post cholecystectomy. Electronically signed by: Lonni Necessary MD 08/19/2024 03:13 PM EDT RP Workstation: [id number]   US  Abdomen Complete Result Date: 08/13/2024 CLINICAL DATA:  acute onset N/V now w/loose stools Elevated LFTs $ WBC count EXAM: ABDOMEN ULTRASOUND COMPLETE COMPARISON:  None Available. FINDINGS: Gallbladder: Gallbladder is  distended with sludge and layering gallstones. There are multiple echogenic foci along the walls which measure up to 8 mm which are non mobile. Gallbladder wall thickness mildly thickened at 3-4 mm. No sonographic Murphy sign noted by sonographer. Common bile duct: Diameter: Visualized portion measures 6 mm, within normal limits. Majority is not visualized. Liver: No focal lesion identified. Within normal limits in parenchymal echogenicity. Portal vein is patent on color Doppler imaging with normal direction of blood flow towards the liver. IVC: No abnormality visualized. Pancreas: Visualized portion unremarkable. Spleen: Spleen is enlarged and measures 14.1 by 13.5 x 6.1 cm for an estimated volume of 604 ML. Right Kidney: Length: 13.2 cm. Echogenicity within  normal limits. No hydronephrosis visualized. There is a solid mass of the RIGHT kidney inferior pole which measures approximately 6.0 x 4.3 x 5.4 cm. Left Kidney: Length: 12.9 cm. Echogenicity within normal limits. No hydronephrosis visualized. Benign cyst is noted measuring 3.2 cm (for which no dedicated imaging follow-up is recommended). Abdominal aorta: No aneurysm visualized. Portions are suboptimally assessed secondary to shadowing bowel gas. Other findings: None. IMPRESSION: 1. There is a solid mass of the RIGHT kidney inferior pole which measures up to 6.0 cm. This is concerning for renal cell carcinoma. Recommend further evaluation with dedicated renal protocol CT or MRI with and without contrast. 2. The gallbladder is distended with sludge and layering gallstones. There is mild gallbladder wall thickening. No sonographic Beverley sign was noted by sonographer. Findings are equivocal for acute cholecystitis but likely reflect a degree of underlying chronic cholecystitis. If there is clinical concern for acute cholecystitis and further imaging is desired, HIDA scan could be of assistance versus attention on dedicated cross-sectional imaging. 3. There are  multiple echogenic foci along the walls of the gallbladder which are non mobile. These may reflect adherent stones or polyps. Recommend follow-up ultrasound in 6 months to assess for stability. 4. Splenomegaly. These results will be called to the ordering clinician or representative by the Radiologist Assistant, and communication documented in the PACS or [employer]. Electronically Signed   By: Corean Salter M.D.   On: 08/13/2024 16:35    Microbiology: No results found for this or any previous visit.  Labs: CBC: Recent Labs  Lab 08/18/24 1232 08/19/24 0535  WBC 9.8 8.4  HGB 15.0 14.5  HCT 43.8 42.0  MCV 85.7 84.5  PLT 385 356   Basic Metabolic Panel: Recent Labs  Lab 08/18/24 1232 08/19/24 0535  NA 139 139  K 4.1 3.7  CL 100 103  CO2 26 26  GLUCOSE 250* 204*  BUN 14 15  CREATININE 0.86 0.93  CALCIUM 8.3* 7.9*   Liver Function Tests: Recent Labs  Lab 08/18/24 1232 08/19/24 0535  AST 23 20  ALT 56* 40  ALKPHOS 94 81  BILITOT 1.6* 2.1*  PROT 6.4* 5.9*  ALBUMIN 3.2* 2.8*   CBG: Recent Labs  Lab 08/20/24 1154 08/20/24 1607 08/20/24 2059 08/21/24 0756 08/21/24 1118  GLUCAP 263* 208* 275* 256* 258*    Discharge time spent: greater than 30 minutes.  Signed: AIDA CHO, MD [facility] 08/21/2024

## 2024-08-21 NOTE — Care Management Important Message (Signed)
 Important Message  Patient Details  Name: Maddax Palinkas [facility] Sr. MRN: [id number] Date of Birth: 11-Nov-1951   Important Message Given:  Yes - Medicare IM     Rojelio SHAUNNA Rattler 08/21/2024, 11:44 AM

## 2024-08-21 NOTE — Progress Notes (Signed)
 [facility] SURGICAL PROGRESS NOTE  Hospital Day(s): 3.   Post op day(s): 2 Days Post-Op.   Interval History:  Patient seen and examined No acute events or new complaints overnight.  Patient reports he is feeling better He is still quite sore; worse with movements  No fever, chills, nausea, emesis No new labs this morning Drain with 40 ccs out; serosanguinous  He is advanced to soft diet Continues on Zosyn  Having bowel function   Vital signs in last 24 hours: [min-max] current  Temp:  [98.2 F (36.8 C)-99.6 F (37.6 C)] 99.3 F (37.4 C) (09/16 0747) Pulse Rate:  [89-99] 95 (09/16 0747) Resp:  [18] 18 (09/16 0747) BP: (131-143)/(62-68) 140/68 (09/16 0747) SpO2:  [92 %-96 %] 92 % (09/16 0747)     Height: 5' 10 (177.8 cm) Weight: 99.3 kg BMI (Calculated): 31.42   Intake/Output last 2 shifts:  09/15 0701 - 09/16 0700 In: 480 [P.O.:480] Out: 490 [Urine:450; Drains:40]   Physical Exam:  Constitutional: alert, cooperative and no distress  Respiratory: breathing non-labored at rest  Cardiovascular: regular rate and sinus rhythm  Gastrointestinal: soft, incisional soreness, he is mildly distended, no rebound/guarding. Surgical drain in right abdomen; serous this AM Integumentary: Laparoscopic incisions are CDI with dermabond, no erythema or drainage   Labs:     Latest Ref Rng & Units 08/19/2024    5:35 AM 08/18/2024   12:32 PM 11/30/2018   10:50 AM  CBC  WBC 4.0 - 10.5 K/uL 8.4  9.8  7.1   Hemoglobin 13.0 - 17.0 g/dL 85.4  84.9  85.3   Hematocrit 39.0 - 52.0 % 42.0  43.8  41.6   Platelets 150 - 400 K/uL 356  385  227       Latest Ref Rng & Units 08/19/2024    5:35 AM 08/18/2024   12:32 PM  CMP  Glucose 70 - 99 mg/dL 795  749   BUN 8 - 23 mg/dL 15  14   Creatinine 9.38 - 1.24 mg/dL 9.06  9.13   Sodium 864 - 145 mmol/L 139  139   Potassium 3.5 - 5.1 mmol/L 3.7  4.1   Chloride 98 - 111 mmol/L 103  100   CO2 22 - 32 mmol/L 26  26   Calcium 8.9 - 10.3  mg/dL 7.9  8.3   Total Protein 6.5 - 8.1 g/dL 5.9  6.4   Total Bilirubin 0.0 - 1.2 mg/dL 2.1  1.6   Alkaline Phos 38 - 126 U/L 81  94   AST 15 - 41 U/L 20  23   ALT 0 - 44 U/L 40  56      Imaging studies: No new pertinent imaging studies   Assessment/Plan:  73 y.o. male 2 Days Post-Op s/p robotic assisted laparoscopic cholecystectomy for gangrenous cholecystitis   - We can continue diet as tolerated; reviewed dietary recommendations for home - Continue surgical drain; monitor and record out - He will DC home with this   - Monitor abdominal examination; on-going bowel function   - Pain control prn; antiemetics prn   - Okay to mobilize; will get STAT PT evaluation   - Further management per primary service; we will follow    - Discharge Planning: He is okay for discharge from surgical perspective. We will follow up next week for drain removal. Will update instructions as well.   All of the above findings and recommendations were discussed with the patient, patient's family (wife), and the medical  team, and all of patient's and family's questions were answered to their expressed satisfaction.  -- Arthea Platt, PA-C [facility] 08/21/2024, 9:45 AM M-F: 7am - 4pm

## 2024-08-21 NOTE — Progress Notes (Signed)
 Patient has mobility limitation that impairs their daily activities, they will need a walker and can safely use it

## 2024-08-21 NOTE — Care Management Important Message (Signed)
 Important Message  Patient Details  Name: Donald Olenik [facility] Sr. MRN: [id number] Date of Birth: August 26, 1951   Important Message Given:  IMM completed on 08/20/24, didn't document      Donald Willis 08/21/2024, 11:43 AM

## 2024-08-21 NOTE — TOC CM/SW Note (Signed)
  Transition of Care [facility]) Screening Note   Patient Details  Name: Donald Willis [facility] Sr. Date of Birth: 04/18/1951   Transition of Care [facility]) CM/SW Contact:    Corean ONEIDA Haddock, RN Phone Number: 08/21/2024, 2:33 PM    Transition of Care Department [facility]) has reviewed patient and no TOC needs have been identified at this time.]If new patient transition needs arise, please place a TOC consult.  Per therapy no follow up recommend RW delivered to room by [employer] for discharge

## 2024-08-21 NOTE — Telephone Encounter (Addendum)
(  Dorthea Fonder) Wife/Caregiver is calling to speak to [place]. Please call her back at [phone]  CCM 5 MINS

## 2024-08-28 ENCOUNTER — Ambulatory Visit: Admitting: General Surgery

## 2024-08-28 ENCOUNTER — Encounter: Payer: Self-pay | Admitting: General Surgery

## 2024-08-28 VITALS — BP 149/76 | HR 81 | Temp 99.0°F | Ht 70.0 in | Wt 220.0 lb

## 2024-08-28 DIAGNOSIS — K810 Acute cholecystitis: Secondary | ICD-10-CM

## 2024-08-28 DIAGNOSIS — Z09 Encounter for follow-up examination after completed treatment for conditions other than malignant neoplasm: Secondary | ICD-10-CM

## 2024-08-28 NOTE — Patient Instructions (Signed)

## 2024-09-02 NOTE — Progress Notes (Signed)
 Outpatient Surgical Follow Up    Donald Willis [facility] Sr. is an 73 y.o. male.   Chief Complaint  Patient presents with   Routine Post Op    Lap choley with drain 08/20/24    HPI: Patient returns today status post robotic assisted cholecystectomy with IOC for acute gangrenous cholecystitis.  He reports doing well.  He denies any uncontrolled abdominal pain.  He is tolerating a diet and having normal bowel function.  His drain is putting out about 30 cc to 40 cc a day of serous fluid.  He denies any bilious drainage from the drain.  Past Medical History:  Diagnosis Date   Abnormal bilirubin test    ELEVATED BILIRIBIN 1.7 12/11/198   Diabetes mellitus without complication (HCC)    HOH (hard of hearing)    uses hearing aides    Past Surgical History:  Procedure Laterality Date   ctr Bilateral 1997   INTRAOPERATIVE CHOLANGIOGRAM N/A 08/19/2024   Procedure: CHOLANGIOGRAM, INTRAOPERATIVE;  Surgeon: Marinda Jayson KIDD, MD;  Location: [facility] ORS;  Service: General;  Laterality: N/A;   KNEE ARTHROSCOPY Left 1999   SHOULDER ARTHROSCOPY Left 2009   X 2    History reviewed. No pertinent family history.  Social History:  reports that he quit smoking about 32 years ago. His smoking use included cigarettes. He has never used smokeless tobacco. He reports that he does not drink alcohol and does not use drugs.  Allergies:  Allergies  Allergen Reactions   Pioglitazone Swelling    Lower extremity swelling - improved with stopping medication   Metformin Rash    Adverse reaction to bowels    Medications reviewed.    ROS Full ROS performed and is otherwise negative other than what is stated in HPI   BP (!) 149/76   Pulse 81   Temp 99 F (37.2 C) (Oral)   Ht 5' 10 (1.778 m)   Wt 220 lb (99.8 kg)   SpO2 94%   BMI 31.57 kg/m   Physical Exam  Abdomen is soft, appropriately tender to palpation.  Incisions are healing well with glue on them.  His left lower quadrant incision does have a  little bit of reactive erythema around it.  There is no drainage from the incision.  His drain is serous.   Pathology consistent with acute gangrenous cholecystitis.  Assessment/Plan:  Patient status post robotic assisted cholecystectomy with intraoperative cholangiogram.  He is doing well and the drain was removed in clinic today.  I told him to watch the erythema around the left lower quadrant incision and if it spreads then we will start him on antibiotics but I do not see indication now.  We will see him in several weeks.  If he is doing well he can cancel this appointment.   Jayson Marinda, M.D. [facility]

## 2024-09-03 ENCOUNTER — Other Ambulatory Visit: Payer: Self-pay

## 2024-09-03 ENCOUNTER — Inpatient Hospital Stay: Attending: Internal Medicine | Admitting: Internal Medicine

## 2024-09-03 ENCOUNTER — Encounter: Payer: Self-pay | Admitting: Internal Medicine

## 2024-09-03 ENCOUNTER — Inpatient Hospital Stay

## 2024-09-03 ENCOUNTER — Inpatient Hospital Stay
Admission: RE | Admit: 2024-09-03 | Discharge: 2024-09-03 | Disposition: A | Payer: Self-pay | Source: Ambulatory Visit | Attending: Internal Medicine

## 2024-09-03 VITALS — BP 139/63 | HR 81 | Temp 97.6°F | Resp 18 | Wt 219.3 lb

## 2024-09-03 DIAGNOSIS — Z87891 Personal history of nicotine dependence: Secondary | ICD-10-CM | POA: Diagnosis not present

## 2024-09-03 DIAGNOSIS — N2889 Other specified disorders of kidney and ureter: Secondary | ICD-10-CM | POA: Insufficient documentation

## 2024-09-03 DIAGNOSIS — M2550 Pain in unspecified joint: Secondary | ICD-10-CM | POA: Insufficient documentation

## 2024-09-03 DIAGNOSIS — K82A1 Gangrene of gallbladder in cholecystitis: Secondary | ICD-10-CM | POA: Insufficient documentation

## 2024-09-03 DIAGNOSIS — Z79899 Other long term (current) drug therapy: Secondary | ICD-10-CM | POA: Diagnosis not present

## 2024-09-03 DIAGNOSIS — M549 Dorsalgia, unspecified: Secondary | ICD-10-CM | POA: Insufficient documentation

## 2024-09-03 DIAGNOSIS — K8012 Calculus of gallbladder with acute and chronic cholecystitis without obstruction: Secondary | ICD-10-CM | POA: Diagnosis not present

## 2024-09-03 DIAGNOSIS — E119 Type 2 diabetes mellitus without complications: Secondary | ICD-10-CM | POA: Diagnosis not present

## 2024-09-03 DIAGNOSIS — Z7985 Long-term (current) use of injectable non-insulin antidiabetic drugs: Secondary | ICD-10-CM | POA: Insufficient documentation

## 2024-09-03 DIAGNOSIS — Z9049 Acquired absence of other specified parts of digestive tract: Secondary | ICD-10-CM | POA: Insufficient documentation

## 2024-09-03 NOTE — Progress Notes (Signed)
 Patient states he would like to know what's going on with his kidney.

## 2024-09-03 NOTE — Progress Notes (Signed)
 [facility] CONSULT NOTE  Patient Care Team: Jeffie Cheryl BRAVO, MD as PCP - General (Family Medicine) Rennie [name] SAUNDERS, MD as Consulting Physician (Oncology)  CHIEF COMPLAINTS/PURPOSE OF CONSULTATION: RIGHT KIDNEY MASS  Oncology History   No history exists.     HISTORY OF PRESENTING ILLNESS:  Donald Victory Fonder Sr. 73 y.o.  male referred to us  for further evaluation of his right kidney mass-which was incidentally noted for workup of his cholecystitis.  [facility] which was done on outpatient basis for abdominal pain-eventually diagnosed with gangrenous cholecystitis. S/p cholecystitis.    As part of the workup patient had a MRI abdomen that showed a right kidney mass.   Review of Systems  Constitutional:  Negative for chills, diaphoresis, fever, malaise/fatigue and weight loss.  HENT:  Negative for nosebleeds and sore throat.   Eyes:  Negative for double vision.  Respiratory:  Negative for cough, hemoptysis, sputum production, shortness of breath and wheezing.   Cardiovascular:  Negative for chest pain, palpitations, orthopnea and leg swelling.  Gastrointestinal:  Negative for abdominal pain, blood in stool, constipation, diarrhea, heartburn, melena, nausea and vomiting.  Genitourinary:  Negative for dysuria, frequency and urgency.  Musculoskeletal:  Positive for back pain and joint pain.  Skin: Negative.  Negative for itching and rash.  Neurological:  Negative for dizziness, tingling, focal weakness, weakness and headaches.  Endo/Heme/Allergies:  Does not bruise/bleed easily.  Psychiatric/Behavioral:  Negative for depression. The patient is not nervous/anxious and does not have insomnia.     MEDICAL HISTORY:  Past Medical History:  Diagnosis Date   Abnormal bilirubin test    ELEVATED BILIRIBIN 1.7 12/11/198   Diabetes mellitus without complication (HCC)    HOH (hard of hearing)    uses hearing  aides    SURGICAL HISTORY: Past Surgical History:  Procedure Laterality Date   ctr Bilateral 1997   INTRAOPERATIVE CHOLANGIOGRAM N/A 08/19/2024   Procedure: CHOLANGIOGRAM, INTRAOPERATIVE;  Surgeon: Marinda Jayson KIDD, MD;  Location: [facility] ORS;  Service: General;  Laterality: N/A;   KNEE ARTHROSCOPY Left 1999   SHOULDER ARTHROSCOPY Left 2009   X 2    SOCIAL HISTORY: Social History   Socioeconomic History   Marital status: Married    Spouse name: Not on file   Number of children: Not on file   Years of education: Not on file   Highest education level: Not on file  Occupational History   Occupation: hvac   Tobacco Use   Smoking status: Former    Current packs/day: 0.00    Types: Cigarettes    Quit date: 02/08/1992    Years since quitting: 32.5   Smokeless tobacco: Never  Vaping Use   Vaping status: Never Used  Substance and Sexual Activity   Alcohol use: No    Comment: rarely 1 beer   Drug use: Never   Sexual activity: Yes  Other Topics Concern   Not on file  Social History Narrative   Not on file   Social Drivers of Health   Financial Resource Strain: Low Risk  (08/30/2024)   [facility] from [facility]   Overall Financial Resource Strain (CARDIA)    Difficulty of Paying Living Expenses: Not hard at all  Food Insecurity: No Food Insecurity (09/03/2024)   Hunger Vital Sign    Worried About Running Out of Food in the Last Year: Never true    Ran Out of Food in the Last Year:  Never true  Transportation Needs: No Transportation Needs (09/03/2024)   PRAPARE - Administrator, Civil Service (Medical): No    Lack of Transportation (Non-Medical): No  Physical Activity: Not on file  Stress: Not on file  Social Connections: Unknown (08/18/2024)   Social Connection and Isolation Panel    Frequency of Communication with Friends and Family: More than three times a week    Frequency of Social Gatherings with Friends and Family: More than three times a  week    Attends Religious Services: Patient declined    Database administrator or Organizations: Patient declined    Attends Banker Meetings: Patient declined    Marital Status: Married  Catering manager Violence: Not At Risk (09/03/2024)   Humiliation, Afraid, Rape, and Kick questionnaire    Fear of Current or Ex-Partner: No    Emotionally Abused: No    Physically Abused: No    Sexually Abused: No    FAMILY HISTORY: History reviewed. No pertinent family history.  ALLERGIES:  is allergic to pioglitazone and metformin.  MEDICATIONS:  Current Outpatient Medications  Medication Sig Dispense Refill   acetaminophen  (TYLENOL ) 325 MG tablet Take 2 tablets (650 mg total) by mouth every 6 (six) hours as needed for mild pain (pain score 1-3) (or Fever >/= 101).     atorvastatin (LIPITOR) 20 MG tablet Take 20 mg by mouth daily.      furosemide  (LASIX ) 20 MG tablet Take 20 mg by mouth daily as needed for fluid or edema.      glyBURIDE (DIABETA) 5 MG tablet Take 5 mg by mouth 2 (two) times daily with a meal.      insulin  lispro (HUMALOG) 100 UNIT/ML KwikPen Inject into the skin.     pantoprazole  (PROTONIX ) 40 MG tablet Take 40 mg by mouth daily.     sildenafil (VIAGRA) 100 MG tablet TAKE 1 TABLET AS DIRECTED     TRULICITY 0.75 MG/0.5ML SOAJ Inject 0.75 mg into the skin once a week.     No current facility-administered medications for this visit.    PHYSICAL EXAMINATION:   Vitals:   09/03/24 1402  BP: 139/63  Pulse: 81  Resp: 18  Temp: 97.6 F (36.4 C)  SpO2: 97%   Filed Weights   09/03/24 1402  Weight: 219 lb 4.8 oz (99.5 kg)    Physical Exam Vitals and nursing note reviewed.  HENT:     Head: Normocephalic and atraumatic.     Mouth/Throat:     Pharynx: Oropharynx is clear.  Eyes:     Extraocular Movements: Extraocular movements intact.     Pupils: Pupils are equal, round, and reactive to light.  Cardiovascular:     Rate and Rhythm: Normal rate and regular  rhythm.  Pulmonary:     Comments: Decreased breath sounds bilaterally.  Abdominal:     Palpations: Abdomen is soft.  Musculoskeletal:        General: Normal range of motion.     Cervical back: Normal range of motion.  Skin:    General: Skin is warm.  Neurological:     General: No focal deficit present.     Mental Status: He is alert and oriented to person, place, and time.  Psychiatric:        Behavior: Behavior normal.        Judgment: Judgment normal.     LABORATORY DATA:  I have reviewed the data as listed Lab Results  Component Value Date  WBC 8.4 08/19/2024   HGB 14.5 08/19/2024   HCT 42.0 08/19/2024   MCV 84.5 08/19/2024   PLT 356 08/19/2024   Recent Labs    08/18/24 1232 08/19/24 0535  NA 139 139  K 4.1 3.7  CL 100 103  CO2 26 26  GLUCOSE 250* 204*  BUN 14 15  CREATININE 0.86 0.93  CALCIUM 8.3* 7.9*  GFRNONAA >60 >60  PROT 6.4* 5.9*  ALBUMIN 3.2* 2.8*  AST 23 20  ALT 56* 40  ALKPHOS 94 81  BILITOT 1.6* 2.1*    RADIOGRAPHIC STUDIES: I have personally reviewed the radiological images as listed and agreed with the findings in the report. DG Cholangiogram Operative Result Date: 08/19/2024 EXAM: INTRAOPERATIVE CHOLANGIOGRAM TECHNIQUE: Fluoroscopy provided by the radiology department. Radiologist was not present during procedure. Image(s) submitted for review. FLUOROSCOPY DOSE AND TYPE: Radiation Dose Index: Reference Air Kerma (in mGy) = 28.65 mGy. 34 sec FT. COMPARISON: None available. CLINICAL HISTORY: [id number] Surgery, elective [id number]. Cholangiogram. FINDINGS: Status post cholecystectomy without leak. No filling defect to suggest choledocholithiasis. Normal contrast emptying into the small bowel. IMPRESSION: 1. Unremarkable intraoperative cholangiogram status post cholecystectomy. Electronically signed by: Lonni Necessary MD 08/19/2024 03:13 PM EDT RP Workstation: [id number]   US  Abdomen Complete Result Date: 08/13/2024 CLINICAL DATA:  acute onset N/V  now w/loose stools Elevated LFTs $ WBC count EXAM: ABDOMEN ULTRASOUND COMPLETE COMPARISON:  None Available. FINDINGS: Gallbladder: Gallbladder is distended with sludge and layering gallstones. There are multiple echogenic foci along the walls which measure up to 8 mm which are non mobile. Gallbladder wall thickness mildly thickened at 3-4 mm. No sonographic Murphy sign noted by sonographer. Common bile duct: Diameter: Visualized portion measures 6 mm, within normal limits. Majority is not visualized. Liver: No focal lesion identified. Within normal limits in parenchymal echogenicity. Portal vein is patent on color Doppler imaging with normal direction of blood flow towards the liver. IVC: No abnormality visualized. Pancreas: Visualized portion unremarkable. Spleen: Spleen is enlarged and measures 14.1 by 13.5 x 6.1 cm for an estimated volume of 604 ML. Right Kidney: Length: 13.2 cm. Echogenicity within normal limits. No hydronephrosis visualized. There is a solid mass of the RIGHT kidney inferior pole which measures approximately 6.0 x 4.3 x 5.4 cm. Left Kidney: Length: 12.9 cm. Echogenicity within normal limits. No hydronephrosis visualized. Benign cyst is noted measuring 3.2 cm (for which no dedicated imaging follow-up is recommended). Abdominal aorta: No aneurysm visualized. Portions are suboptimally assessed secondary to shadowing bowel gas. Other findings: None. IMPRESSION: 1. There is a solid mass of the RIGHT kidney inferior pole which measures up to 6.0 cm. This is concerning for renal cell carcinoma. Recommend further evaluation with dedicated renal protocol CT or MRI with and without contrast. 2. The gallbladder is distended with sludge and layering gallstones. There is mild gallbladder wall thickening. No sonographic Beverley sign was noted by sonographer. Findings are equivocal for acute cholecystitis but likely reflect a degree of underlying chronic cholecystitis. If there is clinical concern for acute  cholecystitis and further imaging is desired, HIDA scan could be of assistance versus attention on dedicated cross-sectional imaging. 3. There are multiple echogenic foci along the walls of the gallbladder which are non mobile. These may reflect adherent stones or polyps. Recommend follow-up ultrasound in 6 months to assess for stability. 4. Splenomegaly. These results will be called to the ordering clinician or representative by the Radiologist Assistant, and communication documented in the PACS or [employer]. Electronically Signed   By:  Corean Salter M.D.   On: 08/13/2024 16:35     Right kidney mass # [facility]- SEP 2025- MRI [for gangrenous cholecystitis]- 4.2 x 4.1 x 7.7 cm Right inferior pole renal mass with extension into the right renal vein  to the level of the renal vein/IVC junction consistent with renal cell  carcinoma. No tumoral  extension into the suprarenal IV  No evidence of distant metastatic disease within the abdomen.  Discussed imaging concerns for right kidney cancer-until proven otherwise.  Would not recommend a biopsy.  Recommend evaluation with urology.  Will also review the images at the cancer conference.  Discussed with Rayfield Jasmine regarding sharing the [employer].  # SEP 2025- Recent acute gangrenous cholecystitis-this post cholecystectomy stable.  # History of diabetes-poorly controlled currently on insulin   Thank you Ms.Gauger for allowing me to participate in the care of your pleasant patient. Please do not hesitate to contact me with questions or concerns in the interim.  Wife will be informed by the staff of the recommendations at the cancer conference  DISPOSITION: # no labs # referral to Urology re: right kidney mass # Follow up TBD- Dr.B  Above plan of care was discussed with patient/family in detail.  My contact information was given to the patient/family.       [name] JONELLE Joe, MD 09/03/2024 4:24 PM

## 2024-09-03 NOTE — Assessment & Plan Note (Addendum)
#   [facility]- SEP 2025- MRI [for gangrenous cholecystitis]- 4.2 x 4.1 x 7.7 cm Right inferior pole renal mass with extension into the right renal vein  to the level of the renal vein/IVC junction consistent with renal cell  carcinoma. No tumoral  extension into the suprarenal IV  No evidence of distant metastatic disease within the abdomen.  Discussed imaging concerns for right kidney cancer-until proven otherwise.  Would not recommend a biopsy.  Recommend evaluation with urology.  Will also review the images at the cancer conference.  Discussed with Rayfield Jasmine regarding sharing the [employer].  # SEP 2025- Recent acute gangrenous cholecystitis-this post cholecystectomy stable.  # History of diabetes-poorly controlled currently on insulin   Thank you Ms.Gauger for allowing me to participate in the care of your pleasant patient. Please do not hesitate to contact me with questions or concerns in the interim.  Wife will be informed by the staff of the recommendations at the cancer conference  DISPOSITION: # no labs # referral to Urology re: right kidney mass # Follow up TBD- Dr.B

## 2024-09-06 ENCOUNTER — Other Ambulatory Visit

## 2024-09-06 ENCOUNTER — Telehealth: Payer: Self-pay | Admitting: Internal Medicine

## 2024-09-06 NOTE — Telephone Encounter (Signed)
 Spoke to patient's wife regarding the discussion at the tumor conference-highly suspicious of RCC.  Await urology evaluation as planned on 10/22.   GB  FYI

## 2024-09-19 ENCOUNTER — Inpatient Hospital Stay: Attending: Internal Medicine | Admitting: Hospice and Palliative Medicine

## 2024-09-19 DIAGNOSIS — N2889 Other specified disorders of kidney and ureter: Secondary | ICD-10-CM

## 2024-09-19 NOTE — Progress Notes (Signed)
 Donald Willis, RMA to Me  TL   09/18/24  5:21 PM Result Note Patient notified, verbalized understanding. No questions or concerns   CCM 6 mins Dr Jeffie 3 mins   Hepatic Function Panel (HFP); CBC w/auto Differential (3 Part)

## 2024-09-19 NOTE — Progress Notes (Signed)
 Multidisciplinary Oncology Council Documentation  Donald Salguero [facility] Sr. was presented by our [facility] on 09/19/2024, which included representatives from:  Palliative Care Dietitian  Physical/Occupational Therapist Nurse Navigator Genetics Social work Survivorship RN Financial Navigator Research RN   Donald Willis currently presents with history of renal mass  We reviewed previous medical and familial history, history of present illness, and recent lab results along with all available histopathologic and imaging studies. The MOC considered available treatment options and made the following recommendations/referrals:  None currently  The MOC is a meeting of clinicians from various specialty areas who evaluate and discuss patients for whom a multidisciplinary approach is being considered. Final determinations in the plan of care are those of the provider(s).   Today's extended care, comprehensive team conference, Donald Willis was not present for the discussion and was not examined.

## 2024-09-20 NOTE — Progress Notes (Signed)
 09/26/24 8:49 AM   Donald Victory Fonder Sr. 1951-03-08 [id number]   HPI: 73 y.o. male here for initial evaluation of a Right renal mass Accompanied by his wife today, very pleasant people Recently diagnosed with a right renal mass during workup for cholecystitis  Denies local symptoms, flank pain, GH, denies constitutional symptoms 40-50-pack-year smoker, quit in 1990s No family history of GU malignancies Abdominal surgery history-cholecystectomy only  MRI A/P (08/17/24, outside films) -    Also, diagnosed with concomitant acute cholecystitis (workup is what led to renal imaging) Former smoker, quit in 1993, rare EtOH   PMH: Past Medical History:  Diagnosis Date   Abnormal bilirubin test    ELEVATED BILIRIBIN 1.7 12/11/198   Diabetes mellitus without complication (HCC)    HOH (hard of hearing)    uses hearing aides    Surgical History: Past Surgical History:  Procedure Laterality Date   ctr Bilateral 1997   INTRAOPERATIVE CHOLANGIOGRAM N/A 08/19/2024   Procedure: CHOLANGIOGRAM, INTRAOPERATIVE;  Surgeon: Marinda Jayson KIDD, MD;  Location: [facility] ORS;  Service: General;  Laterality: N/A;   KNEE ARTHROSCOPY Left 1999   SHOULDER ARTHROSCOPY Left 2009   X 2    Family History: No family history on file.  Social History:  reports that he quit smoking about 32 years ago. His smoking use included cigarettes. He has never used smokeless tobacco. He reports that he does not drink alcohol and does not use drugs.      Physical Exam: BP (!) 151/72 (BP Location: Left Arm, Patient Position: Sitting, Cuff Size: Normal)   Pulse 86   Wt 220 lb (99.8 kg)   SpO2 94%   BMI 31.57 kg/m    Constitutional:  Alert and oriented, No acute distress. Cardiovascular: No clubbing, cyanosis, or edema. Respiratory: Normal respiratory effort, no increased work of breathing. GI: Nondistended Skin: No rashes, bruises or suspicious lesions. Neurologic: Grossly intact, no focal deficits, moving all  4 extremities. Psychiatric: Normal mood and affect.  Laboratory Data:  Latest Reference Range & Units 08/19/24 05:35  Creatinine 0.61 - 1.24 mg/dL 9.06     Pertinent Imaging: I have personally viewed and interpreted the outside MRI Abd/pelvis (08/17/24) -large infiltrative right lower pole renal mass with endophytic and substantial exophytic component measuring up to 7.4 cm in longitudinal diameter.  Cephalad border of tumor margin is poorly circumscribed with appearance of extension into lower pole vasculature, main right renal vein extension.  Contralateral kidney generally morphologically normal, simple left renal cyst.  No obvious regional or retroperitoneal lymphadenopathy.   The central within the lower pole the right kidney, there is a  heterogeneous, hypervascular mass which is difficult to discretely measure  in its entirety, although a representative component of the mass within the  kidney measures 4.2 x 4.1 x 7.7 cm (axial by CC dimensions; 8/52, 2/32).  Portions mass with exophytic extension inferiorly, although the superior  aspect of the mass extends into the right renal vein and centrally to the  level of the right renal vein/IVC junction (i.e. 9/34). No tumoral  extension into the suprarenal IVC. Several cysts elsewhere within the  kidneys. No hydronephrosis. .    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  This appears to be  a locally advanced renal carcinoma with concerning vein involvement and extension to IVC junction.  I think he is a reasonable surgical candidate and my recommendation would be upfront definitive radical nephrectomy.  Considering vein involvement and possible need for open surgery, IVC thrombectomy, as  well as possible adjuvant therapy-- would recommend referral to tertiary center.  Fortunately, appears to have good renal function with a morphologically normal contralateral left kidney.  He does need completion staging with CT Chest imaging.   - Ultimately, patient and his wife preferred referral to [facility].  I will place a referral to one of my colleagues Dr. Alvaro, who specializes in oncology and advanced renal masses. He may also establish within their comprehensive cancer center there.  - Patient may reach out to me at any point with questions, logistical issues or help facilitating referral process -Will order CT chest imaging to complete staging.  He will need to retain copy of disc to upload to [facility] urology office as well  Orders: -     Ambulatory referral to Urology -     CT CHEST W CONTRAST; Future  Right renal mass -     CT CHEST W CONTRAST; Future  Malignant neoplasm of right kidney, except renal pelvis (HCC) -     CT CHEST W CONTRAST; Future      Penne Skye, MD 09/26/2024  [facility] [address], [address] [place], [place] [address] [phone]

## 2024-09-20 NOTE — Assessment & Plan Note (Addendum)
 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  This appears to be a locally advanced renal carcinoma with concerning vein involvement and extension to IVC junction.  I think he is a reasonable surgical candidate and my recommendation would be upfront definitive radical nephrectomy.  Considering vein involvement and possible need for open surgery, IVC thrombectomy, as well as possible adjuvant therapy-- would recommend referral to tertiary center.  Fortunately, appears to have good renal function with a morphologically normal contralateral left kidney.  He does need completion staging with CT Chest imaging.   - Ultimately, patient and his wife preferred referral to [facility].  I will place a referral to one of my colleagues Dr. Alvaro, who specializes in oncology and advanced renal masses. He may also establish within their comprehensive cancer center there.  - Patient may reach out to me at any point with questions, logistical issues or help facilitating referral process -Will order CT chest imaging to complete staging.  He will need to retain copy of disc to upload to [facility] urology office as well

## 2024-09-26 ENCOUNTER — Ambulatory Visit (INDEPENDENT_AMBULATORY_CARE_PROVIDER_SITE_OTHER): Admitting: Urology

## 2024-09-26 VITALS — BP 151/72 | HR 86 | Wt 220.0 lb

## 2024-09-26 DIAGNOSIS — C641 Malignant neoplasm of right kidney, except renal pelvis: Secondary | ICD-10-CM | POA: Diagnosis not present

## 2024-09-26 DIAGNOSIS — N2889 Other specified disorders of kidney and ureter: Secondary | ICD-10-CM

## 2024-09-27 ENCOUNTER — Encounter: Admitting: General Surgery

## 2024-10-04 ENCOUNTER — Ambulatory Visit
Admission: RE | Admit: 2024-10-04 | Discharge: 2024-10-04 | Disposition: A | Discharge status: Home / Self Care | Source: Ambulatory Visit | Attending: Urology | Admitting: Urology

## 2024-10-04 DIAGNOSIS — C641 Malignant neoplasm of right kidney, except renal pelvis: Secondary | ICD-10-CM | POA: Diagnosis present

## 2024-10-04 DIAGNOSIS — N2889 Other specified disorders of kidney and ureter: Secondary | ICD-10-CM | POA: Diagnosis present

## 2024-10-04 MED ORDER — IOHEXOL 300 MG/ML  SOLN
75.0000 mL | Freq: Once | INTRAMUSCULAR | Status: AC | PRN
  Administered 2024-10-04: 75 mL via INTRAVENOUS

## 2024-11-14 ENCOUNTER — Other Ambulatory Visit: Payer: Self-pay | Admitting: Urology

## 2024-11-23 NOTE — Patient Instructions (Addendum)
 SURGICAL WAITING ROOM VISITATION  Patients having surgery or a procedure may have no more than 2 support people in the waiting area - these visitors may rotate.    Children ages 36 and under will not be able to visit patients in [facility] under most circumstances.   Visitors with respiratory illnesses are discouraged from visiting and should remain at home.  If the patient needs to stay at the hospital during part of their recovery, the visitor guidelines for inpatient rooms apply. Pre-op nurse will coordinate an appropriate time for 1 support person to accompany patient in pre-op.  This support person may not rotate.    Please refer to the [facility] website for the visitor guidelines for Inpatients (after your surgery is over and you are in a regular room).       Your procedure is scheduled on: 12-14-24   Report to [facility] Main Entrance    Report to admitting at      0845  AM   Call this number if you have problems the morning of surgery [phone]   Do not eat food  OR DRINK LIQUIDS  :After Midnight.                 If you have questions, please contact your surgeons office.  ONE 8 OZ BOTTLE OF MAGNESIUM CITRATE AT NOON DAY BEFORE SURGERY  FOLLOW  BOWEL PREP AND ANY ADDITIONAL PRE OP INSTRUCTIONS YOU RECEIVED FROM YOUR SURGEON'S OFFICE!!!     Oral Hygiene is also important to reduce your risk of infection.                                    Remember - BRUSH YOUR TEETH THE MORNING OF SURGERY WITH YOUR REGULAR TOOTHPASTE  DENTURES WILL BE REMOVED PRIOR TO SURGERY PLEASE DO NOT APPLY Poly grip OR ADHESIVES!!!   Do NOT smoke after Midnight   Stop all vitamins and herbal supplements 7 days before surgery.   Take these medicines the morning of surgery with A SIP OF WATER: Atorvastatin  DO NOT TAKE ANY ORAL DIABETIC MEDICATIONS DAY OF YOUR SURGERY  GLYBURIDE(DIBETA)  DAY BEFORE SURGERY NO EVENING DOSE  AND NONE DAY OF SURGERY  TRULICITY HOLD  ONE WEEK PRIOR TO SURGERY  HUMALOG SLIDING SCALE INSULIN  NO BEDTIME DOSE NIGHT BEFORE SURGERY    How to Manage Your Diabetes Before and After Surgery  Why is it important to control my blood sugar before and after surgery? Improving blood sugar levels before and after surgery helps healing and can limit problems. A way of improving blood sugar control is eating a healthy diet by:  Eating less sugar and carbohydrates  Increasing activity/exercise  Talking with your doctor about reaching your blood sugar goals High blood sugars (greater than 180 mg/dL) can raise your risk of infections and slow your recovery, so you will need to focus on controlling your diabetes during the weeks before surgery. Make sure that the doctor who takes care of your diabetes knows about your planned surgery including the date and location.  How do I manage my blood sugar before surgery? Check your blood sugar at least 4 times a day, starting 2 days before surgery, to make sure that the level is not too high or low. Check your blood sugar the morning of your surgery when you wake up and every 2 hours until you get to the  Short Stay unit. If your blood sugar is less than 70 mg/dL, you will need to treat for low blood sugar: Do not take insulin . Treat a low blood sugar (less than 70 mg/dL) with  cup of clear juice (cranberry or apple), 4 glucose tablets, OR glucose gel. Recheck blood sugar in 15 minutes after treatment (to make sure it is greater than 70 mg/dL). If your blood sugar is not greater than 70 mg/dL on recheck, call [phone] for further instructions. Report your blood sugar to the short stay nurse when you get to Short Stay.  If you are admitted to the hospital after surgery: Your blood sugar will be checked by the staff and you will probably be given insulin  after surgery (instead of oral diabetes medicines) to make sure you have good blood sugar levels. The goal for blood sugar control after  surgery is 80-180 mg/dL.   WHAT DO I DO ABOUT MY DIABETES MEDICATION?  Do not take oral diabetes medicines (pills) the morning of surgery.  DO NOT TAKE THE FOLLOWING 7 DAYS PRIOR TO SURGERY: Ozempic, Wegovy, Rybelsus (Semaglutide), Byetta (exenatide), Bydureon (exenatide ER), Victoza, Saxenda (liraglutide), or Trulicity (dulaglutide) Mounjaro (Tirzepatide) Adlyxin (Lixisenatide), Polyethylene Glycol Loxenatide.  If your CBG is greater than 220 mg/dL, you may take  of your sliding scale  (correction) dose of insulin .       Bring CPAP mask and tubing day of surgery.                              You may not have any metal on your body including hair pins, jewelry, and body piercing             Do not wear, lotions, powders,/cologne, or deodorant  .               Men may shave face and neck.   Do not bring valuables to the hospital. [facility] IS NOT             RESPONSIBLE   FOR VALUABLES.   Contacts, glasses, dentures or bridgework may not be worn into surgery.   Bring small overnight bag day of surgery.   DO NOT BRING YOUR HOME MEDICATIONS TO THE HOSPITAL. PHARMACY WILL DISPENSE MEDICATIONS LISTED ON YOUR MEDICATION LIST TO YOU DURING YOUR ADMISSION IN THE HOSPITAL!    Patients discharged on the day of surgery will not be allowed to drive home.  Someone NEEDS to stay with you for the first 24 hours after anesthesia.   Special Instructions: Bring a copy of your healthcare power of attorney and living will documents the day of surgery if you haven't scanned them before.              Please read over the following fact sheets you were given: IF YOU HAVE QUESTIONS ABOUT YOUR PRE-OP INSTRUCTIONS PLEASE CALL [phone].    If you test positive for Covid or have been in contact with anyone that has tested positive in the last 10 days please notify you surgeon.     - Preparing for Surgery Before surgery, you can play an important role.  Because skin is not sterile, your  skin needs to be as free of germs as possible.  You can reduce the number of germs on your skin by washing with CHG (chlorahexidine gluconate) soap before surgery.  CHG is an antiseptic cleaner which kills germs and bonds with the skin to  continue killing germs even after washing. Please DO NOT use if you have an allergy to CHG or antibacterial soaps.  If your skin becomes reddened/irritated stop using the CHG and inform your nurse when you arrive at Short Stay. Do not shave (including legs and underarms) for at least 48 hours prior to the first CHG shower.  You may shave your face/neck.  Please follow these instructions carefully:  1.  Shower with CHG Soap the night before surgery ONLY (DO NOT USE THE SOAP THE MORNING OF SURGERY).  2.  If you choose to wash your hair, wash your hair first as usual with your normal  shampoo.  3.  After you shampoo, rinse your hair and body thoroughly to remove the shampoo.                             4.  Use CHG as you would any other liquid soap.  You can apply chg directly to the skin and wash.  Gently with a scrungie or clean washcloth.  5.  Apply the CHG Soap to your body ONLY FROM THE NECK DOWN.   Do  not use on face/ open                           Wound or open sores. Avoid contact with eyes, ears mouth and genitals (private parts).                       Wash face,  Genitals (private parts) with your normal soap.             6.  Wash thoroughly, paying special attention to the area where your  surgery  will be performed.  7.  Thoroughly rinse your body with warm water from the neck down.  8.  DO NOT shower/wash with your normal soap after using and rinsing off the CHG Soap.                9.  Pat yourself dry with a clean towel.            10.  Wear clean pajamas.            11.  Place clean sheets on your bed the night of your first shower and do not  sleep with pets. Day of Surgery : Do not apply any CHG, lotions/deodorants the morning of surgery.  Please  wear clean clothes to the hospital/surgery center.  FAILURE TO FOLLOW THESE INSTRUCTIONS MAY RESULT IN THE CANCELLATION OF YOUR SURGERY  PATIENT SIGNATURE_________________________________  NURSE SIGNATURE__________________________________  ________________________________________________________________________ WHAT IS A BLOOD TRANSFUSION? Blood Transfusion Information  A transfusion is the replacement of blood or some of its parts. Blood is made up of multiple cells which provide different functions. Red blood cells carry oxygen and are used for blood loss replacement. White blood cells fight against infection. Platelets control bleeding. Plasma helps clot blood. Other blood products are available for specialized needs, such as hemophilia or other clotting disorders. BEFORE THE TRANSFUSION  Who gives blood for transfusions?  Healthy volunteers who are fully evaluated to make sure their blood is safe. This is blood bank blood. Transfusion therapy is the safest it has ever been in the practice of medicine. Before blood is taken from a donor, a complete history is taken to make sure that person has no history of diseases nor engages  in risky social behavior (examples are intravenous drug use or sexual activity with multiple partners). The donor's travel history is screened to minimize risk of transmitting infections, such as malaria. The donated blood is tested for signs of infectious diseases, such as HIV and hepatitis. The blood is then tested to be sure it is compatible with you in order to minimize the chance of a transfusion reaction. If you or a relative donates blood, this is often done in anticipation of surgery and is not appropriate for emergency situations. It takes many days to process the donated blood. RISKS AND COMPLICATIONS Although transfusion therapy is very safe and saves many lives, the main dangers of transfusion include:  Getting an infectious disease. Developing a  transfusion reaction. This is an allergic reaction to something in the blood you were given. Every precaution is taken to prevent this. The decision to have a blood transfusion has been considered carefully by your caregiver before blood is given. Blood is not given unless the benefits outweigh the risks. AFTER THE TRANSFUSION Right after receiving a blood transfusion, you will usually feel much better and more energetic. This is especially true if your red blood cells have gotten low (anemic). The transfusion raises the level of the red blood cells which carry oxygen, and this usually causes an energy increase. The nurse administering the transfusion will monitor you carefully for complications. HOME CARE INSTRUCTIONS  No special instructions are needed after a transfusion. You may find your energy is better. Speak with your caregiver about any limitations on activity for underlying diseases you may have. SEEK MEDICAL CARE IF:  Your condition is not improving after your transfusion. You develop redness or irritation at the intravenous (IV) site. SEEK IMMEDIATE MEDICAL CARE IF:  Any of the following symptoms occur over the next 12 hours: Shaking chills. You have a temperature by mouth above 102 F (38.9 C), not controlled by medicine. Chest, back, or muscle pain. People around you feel you are not acting correctly or are confused. Shortness of breath or difficulty breathing. Dizziness and fainting. You get a rash or develop hives. You have a decrease in urine output. Your urine turns a dark color or changes to pink, red, or brown. Any of the following symptoms occur over the next 10 days: You have a temperature by mouth above 102 F (38.9 C), not controlled by medicine. Shortness of breath. Weakness after normal activity. The white part of the eye turns yellow (jaundice). You have a decrease in the amount of urine or are urinating less often. Your urine turns a dark color or changes to  pink, red, or brown. Document Released: 11/19/2000 Document Revised: 02/14/2012 Document Reviewed: 07/08/2008 [facility] Patient Information 2014 [place], [place].  _______________________________________________________________________

## 2024-11-23 NOTE — Progress Notes (Addendum)
 PCP - Cheryl Jericho, MD LOV 10-03-24 epic Cardiologist - N/A  PPM/ICD -  Device Orders -  Rep Notified -   Chest x-ray - CT chest 10-06-24 epic EKG - PREOP Stress Test -  ECHO -  Cardiac Cath -   Sleep Study - n/a CPAP - n/a  Fasting Blood Sugar -140 Checks Blood Sugar _5____ times a day  Blood Thinner Instructions:n/a Aspirin  Instructions:n/a  ERAS Protcol -n/a PRE-SURGERY n/a   TRULICITY HOLD 1 WEEK LAST DOSE 12-27 COVID vaccine -yes  Activity--Able to climb a flight of stairs with no CP or SOB Anesthesia review: DM2 , HOH  Patient denies shortness of breath, fever, cough and chest pain at PAT appointment   All instructions explained to the patient, with a verbal understanding of the material. Patient agrees to go over the instructions while at home for a better understanding. Patient also instructed to self quarantine after being tested for COVID-19. The opportunity to ask questions was provided.

## 2024-11-26 ENCOUNTER — Other Ambulatory Visit: Payer: Self-pay | Admitting: Urology

## 2024-12-04 ENCOUNTER — Other Ambulatory Visit: Payer: Self-pay

## 2024-12-04 ENCOUNTER — Encounter (HOSPITAL_COMMUNITY): Payer: Self-pay

## 2024-12-04 ENCOUNTER — Encounter (HOSPITAL_COMMUNITY)
Admission: RE | Admit: 2024-12-04 | Discharge: 2024-12-04 | Disposition: A | Discharge status: Home / Self Care | Source: Ambulatory Visit | Attending: Urology | Admitting: Urology

## 2024-12-04 VITALS — BP 140/69 | HR 62 | Temp 98.4°F | Resp 16 | Ht 70.0 in | Wt 215.0 lb

## 2024-12-04 DIAGNOSIS — E119 Type 2 diabetes mellitus without complications: Secondary | ICD-10-CM | POA: Diagnosis not present

## 2024-12-04 DIAGNOSIS — Z01810 Encounter for preprocedural cardiovascular examination: Secondary | ICD-10-CM | POA: Diagnosis present

## 2024-12-04 DIAGNOSIS — Z01818 Encounter for other preprocedural examination: Secondary | ICD-10-CM | POA: Insufficient documentation

## 2024-12-04 DIAGNOSIS — Z01812 Encounter for preprocedural laboratory examination: Secondary | ICD-10-CM | POA: Diagnosis present

## 2024-12-04 DIAGNOSIS — I4519 Other right bundle-branch block: Secondary | ICD-10-CM | POA: Insufficient documentation

## 2024-12-04 HISTORY — DX: Essential (primary) hypertension: I10

## 2024-12-04 LAB — COMPREHENSIVE METABOLIC PANEL WITH GFR
ALT: 12 U/L (ref 0–44)
AST: 15 U/L (ref 15–41)
Albumin: 4 g/dL (ref 3.5–5.0)
Alkaline Phosphatase: 105 U/L (ref 38–126)
Anion gap: 8 (ref 5–15)
BUN: 17 mg/dL (ref 8–23)
CO2: 29 mmol/L (ref 22–32)
Calcium: 9.3 mg/dL (ref 8.9–10.3)
Chloride: 103 mmol/L (ref 98–111)
Creatinine, Ser: 0.77 mg/dL (ref 0.61–1.24)
GFR, Estimated: >60 mL/min
Glucose, Bld: 113 mg/dL — ABNORMAL HIGH (ref 70–99)
Potassium: 4.2 mmol/L (ref 3.5–5.1)
Sodium: 140 mmol/L (ref 135–145)
Total Bilirubin: 1.8 mg/dL — ABNORMAL HIGH (ref 0.0–1.2)
Total Protein: 7.1 g/dL (ref 6.5–8.1)

## 2024-12-04 LAB — CBC
HCT: 45.4 % (ref 39.0–52.0)
Hemoglobin: 15.2 g/dL (ref 13.0–17.0)
MCH: 28.7 pg (ref 26.0–34.0)
MCHC: 33.5 g/dL (ref 30.0–36.0)
MCV: 85.7 fL (ref 80.0–100.0)
Platelets: 236 K/uL (ref 150–400)
RBC: 5.3 MIL/uL (ref 4.22–5.81)
RDW: 13.6 % (ref 11.5–15.5)
WBC: 7.6 K/uL (ref 4.0–10.5)
nRBC: 0 % (ref 0.0–0.2)

## 2024-12-04 LAB — HEMOGLOBIN A1C
Hgb A1c MFr Bld: 6.2 % — ABNORMAL HIGH (ref 4.8–5.6)
Mean Plasma Glucose: 131.24 mg/dL

## 2024-12-04 LAB — GLUCOSE, CAPILLARY: Glucose-Capillary: 133 mg/dL — ABNORMAL HIGH (ref 70–99)

## 2024-12-14 ENCOUNTER — Inpatient Hospital Stay (HOSPITAL_COMMUNITY): Payer: Self-pay | Admitting: Medical

## 2024-12-14 ENCOUNTER — Inpatient Hospital Stay (HOSPITAL_COMMUNITY)

## 2024-12-14 ENCOUNTER — Encounter (HOSPITAL_COMMUNITY): Admission: RE | Payer: Self-pay | Source: Ambulatory Visit

## 2024-12-14 ENCOUNTER — Other Ambulatory Visit: Payer: Self-pay

## 2024-12-14 ENCOUNTER — Encounter (HOSPITAL_COMMUNITY): Payer: Self-pay | Admitting: Urology

## 2024-12-14 ENCOUNTER — Inpatient Hospital Stay (HOSPITAL_COMMUNITY)
Admission: RE | Admit: 2024-12-14 | Discharge: 2024-12-15 | DRG: 657 | Disposition: A | Discharge status: Home / Self Care | Attending: Urology | Admitting: Urology

## 2024-12-14 DIAGNOSIS — C641 Malignant neoplasm of right kidney, except renal pelvis: Principal | ICD-10-CM | POA: Diagnosis present

## 2024-12-14 DIAGNOSIS — Z9049 Acquired absence of other specified parts of digestive tract: Secondary | ICD-10-CM | POA: Diagnosis not present

## 2024-12-14 DIAGNOSIS — N2889 Other specified disorders of kidney and ureter: Secondary | ICD-10-CM

## 2024-12-14 DIAGNOSIS — H9193 Unspecified hearing loss, bilateral: Secondary | ICD-10-CM | POA: Diagnosis present

## 2024-12-14 DIAGNOSIS — I823 Embolism and thrombosis of renal vein: Secondary | ICD-10-CM | POA: Diagnosis present

## 2024-12-14 DIAGNOSIS — E119 Type 2 diabetes mellitus without complications: Secondary | ICD-10-CM | POA: Diagnosis present

## 2024-12-14 DIAGNOSIS — Z87891 Personal history of nicotine dependence: Secondary | ICD-10-CM | POA: Diagnosis not present

## 2024-12-14 DIAGNOSIS — I10 Essential (primary) hypertension: Secondary | ICD-10-CM | POA: Diagnosis present

## 2024-12-14 DIAGNOSIS — N281 Cyst of kidney, acquired: Secondary | ICD-10-CM | POA: Diagnosis present

## 2024-12-14 HISTORY — PX: ROBOT ASSISTED LAPAROSCOPIC NEPHRECTOMY: SHX5140

## 2024-12-14 LAB — TYPE AND SCREEN
ABO/RH(D): A POS
Antibody Screen: NEGATIVE

## 2024-12-14 LAB — HEMOGLOBIN AND HEMATOCRIT, BLOOD
HCT: 46.5 % (ref 39.0–52.0)
Hemoglobin: 15.9 g/dL (ref 13.0–17.0)

## 2024-12-14 LAB — GLUCOSE, CAPILLARY
Glucose-Capillary: 132 mg/dL — ABNORMAL HIGH (ref 70–99)
Glucose-Capillary: 212 mg/dL — ABNORMAL HIGH (ref 70–99)
Glucose-Capillary: 261 mg/dL — ABNORMAL HIGH (ref 70–99)

## 2024-12-14 LAB — ABO/RH: ABO/RH(D): A POS

## 2024-12-14 MED ORDER — SUGAMMADEX SODIUM 200 MG/2ML IV SOLN
INTRAVENOUS | Status: AC
  Filled 2024-12-14: qty 2

## 2024-12-14 MED ORDER — HYDROMORPHONE HCL 2 MG/ML IJ SOLN
INTRAMUSCULAR | Status: AC
  Filled 2024-12-14: qty 1

## 2024-12-14 MED ORDER — MAGNESIUM CITRATE PO SOLN: 1.0000 | Freq: Once | ORAL | Status: DC

## 2024-12-14 MED ORDER — ACETAMINOPHEN 10 MG/ML IV SOLN
1000.0000 mg | Freq: Four times a day (QID) | INTRAVENOUS | Status: DC
  Administered 2024-12-14 – 2024-12-15 (×3): 1000 mg via INTRAVENOUS
  Filled 2024-12-14 (×3): qty 100

## 2024-12-14 MED ORDER — OXYCODONE HCL 5 MG/5ML PO SOLN: 5.0000 mg | Freq: Once | ORAL | Status: DC | PRN

## 2024-12-14 MED ORDER — ORAL CARE MOUTH RINSE: 15.0000 mL | Freq: Once | OROMUCOSAL | Status: AC

## 2024-12-14 MED ORDER — DEXAMETHASONE SOD PHOSPHATE PF 10 MG/ML IJ SOLN
INTRAMUSCULAR | Status: DC | PRN
  Administered 2024-12-14: 10 mg via INTRAVENOUS

## 2024-12-14 MED ORDER — FENTANYL CITRATE (PF) 250 MCG/5ML IJ SOLN
INTRAMUSCULAR | Status: DC | PRN
  Administered 2024-12-14 (×2): 50 ug via INTRAVENOUS

## 2024-12-14 MED ORDER — OMEPRAZOLE MAGNESIUM 20 MG PO TBEC: 20.0000 mg | DELAYED_RELEASE_TABLET | Freq: Every day | ORAL | Status: DC

## 2024-12-14 MED ORDER — PROPOFOL 10 MG/ML IV BOLUS
INTRAVENOUS | Status: AC
  Filled 2024-12-14: qty 20

## 2024-12-14 MED ORDER — TRIPLE ANTIBIOTIC 3.5-400-5000 EX OINT: 1.0000 | TOPICAL_OINTMENT | Freq: Three times a day (TID) | CUTANEOUS | Status: DC | PRN

## 2024-12-14 MED ORDER — PROPOFOL 10 MG/ML IV BOLUS
INTRAVENOUS | Status: DC | PRN
  Administered 2024-12-14: 150 mg via INTRAVENOUS

## 2024-12-14 MED ORDER — PHENYLEPHRINE HCL (PRESSORS) 10 MG/ML IV SOLN
INTRAVENOUS | Status: DC | PRN
  Administered 2024-12-14: 80 ug via INTRAVENOUS
  Administered 2024-12-14: 160 ug via INTRAVENOUS

## 2024-12-14 MED ORDER — SODIUM CHLORIDE 0.9 % IV SOLN: INTRAVENOUS | Status: DC

## 2024-12-14 MED ORDER — AMISULPRIDE (ANTIEMETIC) 5 MG/2ML IV SOLN
10.0000 mg | Freq: Once | INTRAVENOUS | Status: AC | PRN
  Administered 2024-12-14: 10 mg via INTRAVENOUS

## 2024-12-14 MED ORDER — ONDANSETRON HCL 4 MG/2ML IJ SOLN
4.0000 mg | INTRAMUSCULAR | Status: DC | PRN
  Administered 2024-12-14: 4 mg via INTRAVENOUS
  Filled 2024-12-14: qty 2

## 2024-12-14 MED ORDER — BUPIVACAINE LIPOSOME 1.3 % IJ SUSP
INTRAMUSCULAR | Status: AC
  Filled 2024-12-14: qty 20

## 2024-12-14 MED ORDER — OXYCODONE HCL 5 MG PO TABS: 5.0000 mg | ORAL_TABLET | Freq: Once | ORAL | Status: DC | PRN

## 2024-12-14 MED ORDER — HYDROMORPHONE HCL 1 MG/ML IJ SOLN
INTRAMUSCULAR | Status: DC | PRN
  Administered 2024-12-14: .4 mg via INTRAVENOUS
  Administered 2024-12-14 (×2): .6 mg via INTRAVENOUS
  Administered 2024-12-14: .4 mg via INTRAVENOUS

## 2024-12-14 MED ORDER — SODIUM CHLORIDE (PF) 0.9 % IJ SOLN
INTRAMUSCULAR | Status: DC | PRN
  Administered 2024-12-14: 20 mL

## 2024-12-14 MED ORDER — HYDRALAZINE HCL 20 MG/ML IJ SOLN: 5.0000 mg | Freq: Four times a day (QID) | INTRAMUSCULAR | Status: DC | PRN

## 2024-12-14 MED ORDER — CEFAZOLIN SODIUM-DEXTROSE 2-4 GM/100ML-% IV SOLN
2.0000 g | INTRAVENOUS | Status: AC
  Administered 2024-12-14: 2 g via INTRAVENOUS
  Filled 2024-12-14: qty 100

## 2024-12-14 MED ORDER — PANTOPRAZOLE SODIUM 40 MG PO TBEC
40.0000 mg | DELAYED_RELEASE_TABLET | Freq: Every day | ORAL | Status: DC
  Administered 2024-12-14: 40 mg via ORAL
  Filled 2024-12-14: qty 1

## 2024-12-14 MED ORDER — HYOSCYAMINE SULFATE 0.125 MG SL SUBL: 0.1250 mg | SUBLINGUAL_TABLET | SUBLINGUAL | Status: DC | PRN

## 2024-12-14 MED ORDER — ACETAMINOPHEN 500 MG PO TABS
1000.0000 mg | ORAL_TABLET | Freq: Once | ORAL | Status: AC
  Administered 2024-12-14: 1000 mg via ORAL
  Filled 2024-12-14: qty 2

## 2024-12-14 MED ORDER — CHLORHEXIDINE GLUCONATE 0.12 % MT SOLN
15.0000 mL | Freq: Once | OROMUCOSAL | Status: AC
  Administered 2024-12-14: 15 mL via OROMUCOSAL

## 2024-12-14 MED ORDER — DIPHENHYDRAMINE HCL 12.5 MG/5ML PO ELIX: 12.5000 mg | ORAL_SOLUTION | Freq: Four times a day (QID) | ORAL | Status: DC | PRN

## 2024-12-14 MED ORDER — INSULIN ASPART 100 UNIT/ML IJ SOLN: 0.0000 [IU] | INTRAMUSCULAR | Status: DC | PRN

## 2024-12-14 MED ORDER — PHENYLEPHRINE HCL-NACL 20-0.9 MG/250ML-% IV SOLN
INTRAVENOUS | Status: DC | PRN
  Administered 2024-12-14: 30 ug/min via INTRAVENOUS

## 2024-12-14 MED ORDER — HYDROMORPHONE HCL 1 MG/ML IJ SOLN: 0.2500 mg | INTRAMUSCULAR | Status: DC | PRN

## 2024-12-14 MED ORDER — SUGAMMADEX SODIUM 200 MG/2ML IV SOLN
INTRAVENOUS | Status: DC | PRN
  Administered 2024-12-14: 200 mg via INTRAVENOUS

## 2024-12-14 MED ORDER — DOCUSATE SODIUM 100 MG PO CAPS
100.0000 mg | ORAL_CAPSULE | Freq: Two times a day (BID) | ORAL | Status: DC
  Administered 2024-12-14 – 2024-12-15 (×2): 100 mg via ORAL
  Filled 2024-12-14 (×2): qty 1

## 2024-12-14 MED ORDER — FENTANYL CITRATE (PF) 100 MCG/2ML IJ SOLN
INTRAMUSCULAR | Status: AC
  Filled 2024-12-14: qty 2

## 2024-12-14 MED ORDER — ONDANSETRON HCL 4 MG/2ML IJ SOLN
INTRAMUSCULAR | Status: AC
  Filled 2024-12-14: qty 2

## 2024-12-14 MED ORDER — LOSARTAN POTASSIUM 25 MG PO TABS
25.0000 mg | ORAL_TABLET | Freq: Every day | ORAL | Status: DC
  Administered 2024-12-14: 25 mg via ORAL
  Filled 2024-12-14: qty 1

## 2024-12-14 MED ORDER — HYDROMORPHONE HCL 1 MG/ML IJ SOLN: 0.5000 mg | INTRAMUSCULAR | Status: DC | PRN

## 2024-12-14 MED ORDER — LIDOCAINE 2% (20 MG/ML) 5 ML SYRINGE
INTRAMUSCULAR | Status: DC | PRN
  Administered 2024-12-14: 60 mg via INTRAVENOUS

## 2024-12-14 MED ORDER — HYDROCODONE-ACETAMINOPHEN 5-325 MG PO TABS: 1.0000 | ORAL_TABLET | Freq: Four times a day (QID) | ORAL | 0 refills | Status: AC | PRN

## 2024-12-14 MED ORDER — ROCURONIUM 10MG/ML (10ML) SYRINGE FOR MEDFUSION PUMP - OPTIME
INTRAVENOUS | Status: DC | PRN
  Administered 2024-12-14: 70 mg via INTRAVENOUS
  Administered 2024-12-14: 30 mg via INTRAVENOUS

## 2024-12-14 MED ORDER — ONDANSETRON HCL 4 MG/2ML IJ SOLN
4.0000 mg | Freq: Once | INTRAMUSCULAR | Status: AC | PRN
  Administered 2024-12-14: 4 mg via INTRAVENOUS

## 2024-12-14 MED ORDER — EPHEDRINE SULFATE (PRESSORS) 25 MG/5ML IV SOSY
PREFILLED_SYRINGE | INTRAVENOUS | Status: DC | PRN
  Administered 2024-12-14: 5 mg via INTRAVENOUS
  Administered 2024-12-14: 10 mg via INTRAVENOUS

## 2024-12-14 MED ORDER — SODIUM CHLORIDE (PF) 0.9 % IJ SOLN
INTRAMUSCULAR | Status: AC
  Filled 2024-12-14: qty 20

## 2024-12-14 MED ORDER — LACTATED RINGERS IV SOLN: INTRAVENOUS | Status: DC

## 2024-12-14 MED ORDER — DIPHENHYDRAMINE HCL 50 MG/ML IJ SOLN: 12.5000 mg | Freq: Four times a day (QID) | INTRAMUSCULAR | Status: DC | PRN

## 2024-12-14 MED ORDER — LACTATED RINGERS IR SOLN
Status: DC | PRN
  Administered 2024-12-14: 1000 mL

## 2024-12-14 MED ORDER — AMISULPRIDE (ANTIEMETIC) 5 MG/2ML IV SOLN
INTRAVENOUS | Status: AC
  Filled 2024-12-14: qty 2

## 2024-12-14 MED ORDER — INSULIN ASPART 100 UNIT/ML IJ SOLN
0.0000 [IU] | Freq: Three times a day (TID) | INTRAMUSCULAR | Status: DC
  Administered 2024-12-15: 3 [IU] via SUBCUTANEOUS
  Filled 2024-12-14: qty 3

## 2024-12-14 MED ORDER — ATORVASTATIN CALCIUM 20 MG PO TABS
20.0000 mg | ORAL_TABLET | Freq: Every day | ORAL | Status: DC
  Administered 2024-12-14 – 2024-12-15 (×2): 20 mg via ORAL
  Filled 2024-12-14 (×2): qty 1

## 2024-12-14 MED ORDER — BUPIVACAINE LIPOSOME 1.3 % IJ SUSP
INTRAMUSCULAR | Status: DC | PRN
  Administered 2024-12-14: 20 mL

## 2024-12-14 MED ORDER — EPHEDRINE 5 MG/ML INJ
INTRAVENOUS | Status: AC
  Filled 2024-12-14: qty 5

## 2024-12-14 MED ORDER — ONDANSETRON HCL 4 MG/2ML IJ SOLN
INTRAMUSCULAR | Status: DC | PRN
  Administered 2024-12-14: 4 mg via INTRAVENOUS

## 2024-12-14 MED ORDER — STERILE WATER FOR IRRIGATION IR SOLN
Status: DC | PRN
  Administered 2024-12-14: 1000 mL

## 2024-12-14 MED ORDER — OXYCODONE HCL 5 MG PO TABS
5.0000 mg | ORAL_TABLET | ORAL | Status: DC | PRN
  Administered 2024-12-14: 5 mg via ORAL
  Filled 2024-12-14: qty 1

## 2024-12-14 MED ORDER — DOCUSATE SODIUM 100 MG PO CAPS: 100.0000 mg | ORAL_CAPSULE | Freq: Two times a day (BID) | ORAL | Status: AC

## 2024-12-14 NOTE — H&P (Signed)
 Donald Victory Fonder Sr. is an 74 y.o. male.    Chief Complaint: Pre-OP RIGHT Robotic Radical Nephrectomy / Retroperitoneal Node Dissection  HPI:   1 - Stage 3 RIGHT Renal Cancer - 6cm RLP mass wtih renal vein thrombus (close but not in IVC) incidetnal on imaging 08/2024 durign cholecystitis. Dedicated MRI with 1 artery / 1 vein right renovascular anatomy (few parasitics, large ipsilateral gonadal), chest CT wtih <28mm equivocal pulm nodules, no sig adenopathy. Cr 0.9, contralateral kidney only wtih simple cycts. Has G. Rennie MD with med-onc at [facility] as well.  2 - NON-Complex LEFT Renal Cysts - 2cm left mid and few scattered punctate cysts w/o complex features on MRI 2020.  PMH sig for lap chole (08/2024, gangrenous), remote 40PY smoker, CHF/Lasix , IDDM2 (A1c 7s). Retired from [employer] work in [place] area, then [place]. Wife Donald Willis involved. His PCP is Dr. Maryella.  Today Donald Willis is seen to proceed with RIGHT radical nephrectomy for locally advanced neoplasm. Cr 0.77, Hgb 15, A1c 6's most recently.   Past Medical History:  Diagnosis Date   Abnormal bilirubin test    ELEVATED BILIRIBIN 1.7 12/11/198   Diabetes mellitus without complication (HCC)    TYPE 2   HOH (hard of hearing)    uses hearing aides   Hypertension     Past Surgical History:  Procedure Laterality Date   ctr Bilateral 1997   INTRAOPERATIVE CHOLANGIOGRAM N/A 08/19/2024   Procedure: CHOLANGIOGRAM, INTRAOPERATIVE;  Surgeon: Marinda Jayson KIDD, MD;  Location: [facility] ORS;  Service: General;  Laterality: N/A;   KNEE ARTHROSCOPY Left 1999   SHOULDER ARTHROSCOPY Left 2009   X 2    No family history on file. Social History:  reports that he quit smoking about 32 years ago. His smoking use included cigarettes. He has never used smokeless tobacco. He reports that he does not drink alcohol and does not use drugs.  Allergies: Allergies[1]  No medications prior to admission.    No results found for this or any previous visit (from the past  48 hours). No results found.  Review of Systems  Constitutional:  Negative for chills and fever.  All other systems reviewed and are negative.   There were no vitals taken for this visit. Physical Exam Vitals reviewed.  HENT:     Head: Normocephalic.  Eyes:     Pupils: Pupils are equal, round, and reactive to light.  Cardiovascular:     Rate and Rhythm: Normal rate.  Pulmonary:     Effort: Pulmonary effort is normal.  Abdominal:     General: Abdomen is flat.  Genitourinary:    Comments: No CVAT at present Musculoskeletal:        General: Normal range of motion.     Cervical back: Normal range of motion.  Skin:    General: Skin is warm.  Neurological:     General: No focal deficit present.     Mental Status: He is alert.  Psychiatric:        Mood and Affect: Mood normal.      Assessment/Plan  Proceed as planned with RIGHT radical nephrectomy. Risks, benefits, alternatives, expected peri-op course discussed previously and reiterated today. He understands that his prior surgery and large thrombus make things more complex and increase risk of complications / conversion to open / transfusion / mortality.   Ricardo KATHEE Alvaro Mickey., MD 12/14/2024, 6:46 AM       [1]  Allergies Allergen Reactions   Pioglitazone Swelling    Lower extremity  swelling - improved with stopping medication   Metformin Rash    Adverse reaction to bowels

## 2024-12-14 NOTE — Op Note (Unsigned)
 NAME: Donald Willis, TROW MEDICAL RECORD NO: [id number] ACCOUNT NO: [id number] DATE OF BIRTH: August 20, 1951 FACILITY: [facility] LOCATION: [id number] PHYSICIAN: Ricardo Likens, MD  Operative Report   DATE OF PROCEDURE: 12/14/2024  SURGEON: Ricardo Likens, MD  PREOPERATIVE DIAGNOSIS:  Right renal mass with tumor thrombus.  PROCEDURE PERFORMED:  Robotic assisted laparoscopic right radical nephrectomy with retroperineal lymph node dissection.  ASSISTANT:  Alan Hammonds, PA.  ESTIMATED BLOOD LOSS:  50 mL.  COMPLICATIONS:  None.  SPECIMENS:  Right kidney with tumor thrombus, adrenal gland, retroperineal lymph nodes en bloc.  FINDINGS: 1.  Single artery, single vein, right renal vascular anatomy.  2.  There were numerous parasitic vessels.  3.  A large tumor thrombus within renal vein, however grossly not involving the vein wall or inferior vena cava.  4.  Borderline perihilar adenopathy especially superior to the area of vein.  INDICATIONS:  The patient is a very pleasant 74 year old man.  He was found on workup of severe cholecystitis with infection and had an impressive right renal mass with tumor thrombus.  He underwent cholecystectomy several months ago and therapy for his  severely infected gallbladder and subsequently, he got cleared of his infectious parameters.  He initially saw one of my colleagues at the [facility] [place] office for urologic evaluation.  He recommended surgery and plans for a radical nephrectomy.  He was  referred for this.  I evaluated the patient and felt him to be a suitable candidate.  I felt that a slight interval period of time to allow further healing from his infected gallbladder be warranted.  We had agreed upon surgery in early January.  He  presents for this today.   Informed consent was obtained and placed in medical record.   DESCRIPTION OF PROCEDURE:  The patient being Verdis Koval identified and verified and the procedure being right robotic radical nephrectomy with  retroperineal lymph node dissection was confirmed.  Procedure timeout was performed.  Intravenous antibiotics  administered.  General endotracheal anesthesia was induced.  Foley catheter was placed with straight drain.  The patient was placed in the right lateral decubitus position employing 15 degrees of table flexion, superior arm elevator, axillary roll,  sequential compression devices, bottom leg bent, top leg straight.  He was further fastened to the table using 3-inch tape over foam padding across his supraxiphoid chest and his pelvis.  A sterile field was created by clipper shaving his right abdomen  and then prepping with chlorhexidine  gluconate.  Next, a high-flow, low pressure pneumoperitoneum was obtained using the Veress technique after having passed the aspiration and drop test. An 8-mm robotic camera port was then placed in the same  location approximately 2 handbreadths superior and lateral to the umbilicus.  Laparoscopic examination of the peritoneal cavity immediately revealed significant adhesions.  Some omental adhesions between the omentum and the abdominal wall as well as the  inferior aspect of the liver.  And there were numerous adhesions between the liver and Gerota's fascia and the liver and superior aspect of the peritoneum and diaphragmatic root.  However no active purulence was noted.  A right inferior paramedian  robotic port was then placed approximately 1.5 handbreadths superior to the pubic ramus.  Paramedian inferior assistant port approximately 3 fingerbreadths inferior to the plane of the camera port in a paramedian location, non-AirSeal type.  And another  12-millimeter assistant port 3 fingerbreadths superior to this plane of the camera port, AirSeal type.  An 8-millimeter robotic camera port in a subcostal location.  And  another right far lateral 8-millimeter robotic port approximately 1 handbreadth  superior and medial to the anterior superior iliac spine.  It was  immediately felt that adhesiolysis on laparoscopically some of the omental adhesions were released from the abdominal wall anteriorly allowing better inspection of this area and visualization.  Fortunately,  no bowel was within this.  Robot was then docked.  Passed the electronic checks.  Additionally, adhesiolysis was performed of the inferior liver aspect and the superior and anterior surface of Gerota's fascia to release the liver away and between the  liver and some omentum. The lateral adhesions provided superior hepatic retraction.  A 5-millimeter subxiphoid port was placed for liver retraction however this was actually not needed as the adhesions between the liver and the diaphragm actually held this quite  effectively superiorly.  Attention was then directed to development of the right retroperitoneum.  Incision was made lateral to the ascending colon extending from the area of the cecum towards the area of the hepatic flexure.  This was very carefully swept medially.  The retroperitoneum was  significantly desmoplastic as anticipated.  The kidney with tumor en bloc was very very large.  Lower pole of the kidney was identified, placed on gentle lateral retraction and dissection proceeded medial to this.  Ureter and gonadal vessels were encountered as was the inferior vena cava.   This portion of the ureter and gonadal vessels were swept laterally and the dissection proceeded directly onto the inferior vena cava dissecting superiorly.  The insertion of the gonadals to the IVC was encountered.  This was ligated using a vascular  stapler as there are numerous parasitics contributing to the gonadals.  Again, this plane was again proceeded superiorly towards the area of the renal hilum, directly skimming all lymphatic tissue off of the inferior vena cava, thus inherently performing  en bloc retroperitoneal lymph node dissection.  Hilum was somewhat complex consisting of a single artery and single vein  however numerous parasitic vessels entering the vein inferiorly and superiorly.  And exquisite care was taken to control these using  a combination of bipolar energy and small clips.  Artery was controlled using an extra-large Hem-o-lok clip proximal, vascular stapler distal.  And dissection was performed superior to the renal vein again directly on the inferior vena cava sweeping  more lymphatic tissue en bloc with the kidney specimen towards the area of the adrenal gland.  This provided much better circumferential visualization of the renal vein and inferior vena cava interface.  It was clear that there was significant tumor  thrombus within the vein however with very careful visualization and robotic milking of this, it did not appear to actually extend into the inferior vena cava or and directly invade the wall of the renal vein.  This did appear amenable then to a stapled ligation  of the vein, very proximal.  This was performed using a vascular stapler essentially flushed with the inferior vena cava.  I was quite happy with the safety and completeness of this oncologically and in terms of safety.  The adrenal gland was kept en  bloc with the kidney.  Medial adrenal attachments were taken down using mostly bipolar energy and cautery scissors.  Superior attachments were taken down similarly as were superior upper pole attachments and additional ones between the inferior aspect of  the liver.  And again, this plane was quite desmoplastic given his prior recent severe cholecystitis.  This completed the medial plane dissection.  Again, I was quite  happy with the safety of this.  Inferior aspect was then carefully dissected and the  ureter and gonadal vessels were encountered.  Ureter was doubly clipped and ligated.  The gonadal vessels again were controlled using a vascular stapler in the inferior aspect.  Lateral attachments were taken down with cautery scissors and again,  numerous parasitic vessels were  encountered and very carefully controlled using bipolar energy or surgical clips.  This completely freed up the very large right en bloc kidney with retroperineal lymph nodes and adrenal gland specimen.  This was much too  large to fit in any sort of endoscopic retrieval bag.  Robot was then undocked.  The specimen was retrieved by connecting the 2 previous assistant port sites in the paramedian location and even extending further inferiorly for an additional 2 inches.   This allowed delivery of the very large radical nephrectomy specimen which was sent for permanent pathology.  Abdomen was then re-inspected via the extraction site.  Hemostasis was excellent.  Fascia was reapproximated using an figure-of-eight PDS x 10  followed by reapproximation with Scarpa's with running Vicryl.  All incision sites were infiltrated with dilute lipolyzed Marcaine  and closed at the level of skin using subcuticular Monocryl followed by Dermabond.  The procedure was then terminated.  The  patient tolerated the procedure well.  No immediate periprocedural complications.  The patient was taken to the postanesthesia care unit in stable condition.  Plan is for inpatient admission.  Please note, first assistant, Alan Hammonds, was crucial for all portions of the surgery today.  She provided invaluable retraction, suctioning, vascular clipping, vascular stapling, robotic instrument exchange, and general first assistance.   MUK D: 12/14/2024 1:54:02 pm T: 12/14/2024 9:37:00 pm  JOB: [id number]/ [id number]

## 2024-12-14 NOTE — Anesthesia Postprocedure Evaluation (Signed)
"   Anesthesia Post Note  Patient: Donald Victory Fonder Sr.  Procedure(s) Performed: NEPHRECTOMY, RADICAL, ROBOT-ASSISTED, LAPAROSCOPIC, ADULT (Right: Abdomen) LYMPHADENECTOMY, PELVIS, ROBOT-ASSISTED (Abdomen)     Patient location during evaluation: PACU Anesthesia Type: General Level of consciousness: awake and alert, oriented and patient cooperative Pain management: pain level controlled Vital Signs Assessment: post-procedure vital signs reviewed and stable Respiratory status: spontaneous breathing, nonlabored ventilation and respiratory function stable Cardiovascular status: blood pressure returned to baseline and stable Postop Assessment: no apparent nausea or vomiting Anesthetic complications: no   No notable events documented.  Last Vitals:  Vitals:   12/14/24 1700 12/14/24 1725  BP: (!) 147/61 (!) 145/62  Pulse: 73 76  Resp:  20  Temp:  (!) 36.3 C  SpO2: 95% 93%    Last Pain:  Vitals:   12/14/24 1725  TempSrc: Oral  PainSc:                  Donald Willis      "

## 2024-12-14 NOTE — Transfer of Care (Signed)
 Immediate Anesthesia Transfer of Care Note  Patient: Donald Victory Fonder Sr.  Procedure(s) Performed: NEPHRECTOMY, RADICAL, ROBOT-ASSISTED, LAPAROSCOPIC, ADULT (Right: Abdomen) LYMPHADENECTOMY, PELVIS, ROBOT-ASSISTED (Abdomen)  Patient Location: PACU  Anesthesia Type:General  Level of Consciousness: drowsy and patient cooperative  Airway & Oxygen Therapy: Patient Spontanous Breathing and Patient connected to face mask oxygen  Post-op Assessment: Report given to RN and Post -op Vital signs reviewed and stable  Post vital signs: Reviewed and stable  Last Vitals:  Vitals Value Taken Time  BP    Temp    Pulse 74 12/14/24 14:17  Resp 13 12/14/24 14:17  SpO2 94 % 12/14/24 14:17  Vitals shown include unfiled device data.  Last Pain:  Vitals:   12/14/24 0928  TempSrc:   PainSc: 0-No pain         Complications: No notable events documented.

## 2024-12-14 NOTE — Discharge Instructions (Signed)

## 2024-12-14 NOTE — Anesthesia Procedure Notes (Signed)
 Procedure Name: Intubation Date/Time: 12/14/2024 10:34 AM  Performed by: Sofie Barter, RNPre-anesthesia Checklist: Patient identified, Emergency Drugs available, Suction available and Patient being monitored Patient Re-evaluated:Patient Re-evaluated prior to induction Oxygen Delivery Method: Circle system utilized Preoxygenation: Pre-oxygenation with 100% oxygen Induction Type: IV induction Ventilation: Oral airway inserted - appropriate to patient size and Two handed mask ventilation required Laryngoscope Size: Mac and 3 Grade View: Grade II Tube type: Oral Tube size: 7.5 mm Number of attempts: 1 Airway Equipment and Method: Stylet and Oral airway Placement Confirmation: ETT inserted through vocal cords under direct vision, positive ETCO2 and breath sounds checked- equal and bilateral Secured at: 23 cm Tube secured with: Tape Dental Injury: Teeth and Oropharynx as per pre-operative assessment

## 2024-12-14 NOTE — Anesthesia Preprocedure Evaluation (Addendum)
"                                    Anesthesia Evaluation  Patient identified by MRN, date of birth, ID band Patient awake    Reviewed: Allergy & Precautions, NPO status , Patient's Chart, lab work & pertinent test results  Airway Mallampati: III  TM Distance: >3 FB Neck ROM: Full    Dental  (+) Dental Advisory Given, Missing, Chipped, Poor Dentition,  Very poor dentition throughout:   Pulmonary former smoker   Pulmonary exam normal breath sounds clear to auscultation       Cardiovascular hypertension (167/69 preop, per pt normally 130s SBP), Pt. on medications Normal cardiovascular exam Rhythm:Regular Rate:Normal     Neuro/Psych negative neurological ROS  negative psych ROS   GI/Hepatic Neg liver ROS,GERD  Medicated and Controlled,,  Endo/Other  diabetes, Well Controlled, Type 2, Insulin  Dependent, Oral Hypoglycemic Agents  BMI 31 A1c 6.2  Renal/GU negative Renal ROS  negative genitourinary   Musculoskeletal negative musculoskeletal ROS (+)    Abdominal   Peds  Hematology negative hematology ROS (+) Hb 15, plt 236   Anesthesia Other Findings Trulicity LD: >7days  Reproductive/Obstetrics negative OB ROS                              Anesthesia Physical Anesthesia Plan  ASA: 2  Anesthesia Plan: General   Post-op Pain Management: Tylenol  PO (pre-op)* and Dilaudid  IV   Induction: Intravenous  PONV Risk Score and Plan: 3 and Ondansetron , Dexamethasone , Midazolam  and Treatment may vary due to age or medical condition  Airway Management Planned: Oral ETT  Additional Equipment: None  Intra-op Plan:   Post-operative Plan: Extubation in OR  Informed Consent: I have reviewed the patients History and Physical, chart, labs and discussed the procedure including the risks, benefits and alternatives for the proposed anesthesia with the patient or authorized representative who has indicated his/her understanding and acceptance.      Dental advisory given  Plan Discussed with: CRNA  Anesthesia Plan Comments:          Anesthesia Quick Evaluation  "

## 2024-12-14 NOTE — Brief Op Note (Signed)
 12/14/2024 11:00 AM  1:42 PM  PATIENT:  Donald Victory Fonder Sr.  74 y.o. male  PRE-OPERATIVE DIAGNOSIS:  KIDNEY CANCER  POST-OPERATIVE DIAGNOSIS:  KIDNEY CANCER  PROCEDURE:  Procedures: NEPHRECTOMY, RADICAL, ROBOT-ASSISTED, LAPAROSCOPIC, ADULT (Right) LYMPHADENECTOMY, PELVIS, ROBOT-ASSISTED (N/A)  SURGEON:  Surgeons and Role:    * Manny, Ricardo KATHEE Raddle., MD - Primary  PHYSICIAN ASSISTANT:   ASSISTANTS: Alan Hammonds PA   ANESTHESIA:   local and general  EBL:  100 mL   BLOOD ADMINISTERED:none  DRAINS: foley to gravity   LOCAL MEDICATIONS USED:  MARCAINE      SPECIMEN:  Source of Specimen:  RT kidney with retroperitoneal lymph nodes + adrenal glans + tumor thrombus en bloc  DISPOSITION OF SPECIMEN:  PATHOLOGY  COUNTS:  YES  TOURNIQUET:  * No tourniquets in log *  DICTATION: .Other Dictation: Dictation Number [id number]  PLAN OF CARE: Admit to inpatient   PATIENT DISPOSITION:  PACU - hemodynamically stable.   Delay start of Pharmacological VTE agent (>24hrs) due to surgical blood loss or risk of bleeding: yes

## 2024-12-15 ENCOUNTER — Encounter (HOSPITAL_COMMUNITY): Payer: Self-pay | Admitting: Urology

## 2024-12-15 ENCOUNTER — Other Ambulatory Visit (HOSPITAL_COMMUNITY): Payer: Self-pay

## 2024-12-15 LAB — BASIC METABOLIC PANEL WITH GFR
Anion gap: 7 (ref 5–15)
BUN: 22 mg/dL (ref 8–23)
CO2: 27 mmol/L (ref 22–32)
Calcium: 8 mg/dL — ABNORMAL LOW (ref 8.9–10.3)
Chloride: 103 mmol/L (ref 98–111)
Creatinine, Ser: 1.4 mg/dL — ABNORMAL HIGH (ref 0.61–1.24)
GFR, Estimated: 53 mL/min — ABNORMAL LOW
Glucose, Bld: 210 mg/dL — ABNORMAL HIGH (ref 70–99)
Potassium: 5 mmol/L (ref 3.5–5.1)
Sodium: 137 mmol/L (ref 135–145)

## 2024-12-15 LAB — HEMOGLOBIN AND HEMATOCRIT, BLOOD
HCT: 41.4 % (ref 39.0–52.0)
Hemoglobin: 14.2 g/dL (ref 13.0–17.0)

## 2024-12-15 LAB — GLUCOSE, CAPILLARY
Glucose-Capillary: 157 mg/dL — ABNORMAL HIGH (ref 70–99)
Glucose-Capillary: 215 mg/dL — ABNORMAL HIGH (ref 70–99)

## 2024-12-15 MED ORDER — ACETAMINOPHEN 500 MG PO TABS
1000.0000 mg | ORAL_TABLET | Freq: Four times a day (QID) | ORAL | 0 refills | Status: AC | PRN
  Filled 2024-12-15: qty 56, 7d supply, fill #0

## 2024-12-15 MED ORDER — METHOCARBAMOL 500 MG PO TABS: 500.0000 mg | ORAL_TABLET | Freq: Three times a day (TID) | ORAL | Status: DC | PRN

## 2024-12-15 NOTE — Progress Notes (Signed)
 Discharge meds in a secure bag delivered to patient by this RN

## 2024-12-15 NOTE — Progress Notes (Addendum)
" ° °  1 Day Post-Op Subjective: Feeling well, mild expected abdominal discomfort, catheter with clear yellow urine, incisions c/d/I, no nausea/vomiting, ambulating without difficulty   Objective: Vital signs in last 24 hours: Temp:  [97.4 F (36.3 C)-98.5 F (36.9 C)] 98 F (36.7 C) (01/10 0805) Pulse Rate:  [62-96] 62 (01/10 0805) Resp:  [15-20] 16 (01/10 0601) BP: (97-175)/(46-72) 97/46 (01/10 0805) SpO2:  [90 %-96 %] 93 % (01/10 0805) Weight:  [97.5 kg] 97.5 kg (01/09 1813)  Assessment/Plan:  -ToV -Ambulate, med lock -CLD diet -Pending morning, could discharge later today   Intake/Output from previous day: 01/09 0701 - 01/10 0700 In: 3026.4 [P.O.:120; I.V.:2589.4; IV Piggyback:317] Out: 1000 [Urine:900; Blood:100]  Intake/Output this shift: No intake/output data recorded.  Physical Exam:  General: Alert and oriented CV: No cyanosis Lungs: equal chest rise Abdomen: Soft, NTND, no rebound or guarding Gu: Catheter in place with clear yellow urine  Lab Results: Recent Labs    12/14/24 1456 12/15/24 0429  HGB 15.9 14.2  HCT 46.5 41.4   BMET Recent Labs    12/15/24 0429  NA 137  K 5.0  CL 103  CO2 27  GLUCOSE 210*  BUN 22  CREATININE 1.40*  CALCIUM  8.0*     Studies/Results: No results found.    LOS: 1 day    Dr. Carolee is the attending of record for this consult.    12/15/2024, 9:03 AM   "

## 2024-12-15 NOTE — Discharge Summary (Signed)
 Date of admission: 12/14/2024  Date of discharge: 12/15/2024  Admission diagnosis: Kidney Mass  Discharge diagnosis: Kidney Mass  History and Physical: For full details, please see admission history and physical. Briefly, Donald Victory Fonder Sr. is a 74 y.o. gentleman with kidney mass.  After discussing management/treatment options, he elected to proceed with surgical treatment.  Hospital Course: Donald Willis [facility] Sr. was taken to the operating room on 12/14/2024 and underwent a robotic assisted laparoscopic radical nephrectomy. He tolerated this procedure well and without complications. Postoperatively, he was able to be transferred to a regular hospital room following recovery from anesthesia.  He was able to begin ambulating the night of surgery. He remained hemodynamically stable overnight.  He had excellent urine output and underwent a ToV on POD1.  He was transitioned to oral pain medication, tolerated a clear liquid diet, and had met all discharge criteria and was able to be discharged home later on POD#1.  Laboratory values:  Recent Labs    12/14/24 1456 12/15/24 0429  HGB 15.9 14.2  HCT 46.5 41.4    Disposition: Home  Discharge instruction: He was instructed to be ambulatory but to refrain from heavy lifting, strenuous activity, or driving.  Discharge medications:   Allergies as of 12/15/2024       Reactions   Pioglitazone Swelling   Lower extremity swelling - improved with stopping medication   Metformin Rash   Adverse reaction to bowels        Medication List     TAKE these medications    acetaminophen  325 MG tablet Commonly known as: TYLENOL  Take 2 tablets (650 mg total) by mouth every 6 (six) hours as needed for mild pain (pain score 1-3) (or Fever >/= 101). What changed: Another medication with the same name was added. Make sure you understand how and when to take each.   acetaminophen  500 MG tablet Commonly known as: TYLENOL  Take 2 tablets (1,000 mg total) by  mouth every 6 (six) hours as needed for up to 7 days. What changed: You were already taking a medication with the same name, and this prescription was added. Make sure you understand how and when to take each.   atorvastatin  20 MG tablet Commonly known as: LIPITOR Take 20 mg by mouth daily.   docusate sodium  100 MG capsule Commonly known as: COLACE Take 1 capsule (100 mg total) by mouth 2 (two) times daily.   Embecta Pen Needle Ultrafine 31G X 8 MM Misc Generic drug: Insulin  Pen Needle   fluticasone 50 MCG/ACT nasal spray Commonly known as: FLONASE Place 2 sprays into both nostrils daily as needed for allergies or rhinitis.   furosemide  20 MG tablet Commonly known as: LASIX  Take 20 mg by mouth daily as needed for fluid or edema.   glyBURIDE 5 MG tablet Commonly known as: DIABETA Take 5 mg by mouth 2 (two) times daily with a meal.   HYDROcodone -acetaminophen  5-325 MG tablet Commonly known as: NORCO/VICODIN Take 1-2 tablets by mouth every 6 (six) hours as needed for moderate pain (pain score 4-6) or severe pain (pain score 7-10).   insulin  lispro 100 UNIT/ML KwikPen Commonly known as: HUMALOG Inject 0-2 Units into the skin 5 (five) times daily as needed (blood sugar of 150 or above).   losartan  25 MG tablet Commonly known as: COZAAR  Take 25 mg by mouth daily.   omeprazole  20 MG tablet Commonly known as: PRILOSEC  OTC Take 20 mg by mouth daily with supper.   Precision QID Test test  strip Generic drug: glucose blood 1 strip.   Trulicity 0.75 MG/0.5ML Soaj Generic drug: Dulaglutide Inject 0.75 mg into the skin once a week.   Viagra 100 MG tablet Generic drug: sildenafil TAKE 1 TABLET AS DIRECTED        Followup: He will followup in 4-6 weeks for a post-op visit.

## 2024-12-15 NOTE — Plan of Care (Signed)
" °  Problem: Nutritional: Goal: Will attain and maintain optimal nutritional status Outcome: Progressing   Problem: Clinical Measurements: Goal: Ability to maintain clinical measurements within normal limits Outcome: Progressing   Problem: Clinical Measurements: Goal: Postoperative complications will be avoided or minimized Outcome: Progressing   Problem: Respiratory: Goal: Respiratory status will improve Outcome: Progressing   Problem: Skin Integrity: Goal: Demonstrates signs of wound healing without infection Outcome: Progressing   "

## 2024-12-17 LAB — GLUCOSE, CAPILLARY: Glucose-Capillary: 190 mg/dL — ABNORMAL HIGH (ref 70–99)

## 2024-12-18 LAB — SURGICAL PATHOLOGY
